# Patient Record
Sex: Male | Born: 1990 | Hispanic: No | Marital: Single | Smoking: Current every day smoker
Health system: Southern US, Community
[De-identification: ages and names within clinical notes are randomized; demographics above are authoritative.]

## PROBLEM LIST (undated history)

## (undated) DIAGNOSIS — I499 Cardiac arrhythmia, unspecified: Secondary | ICD-10-CM

## (undated) DIAGNOSIS — N200 Calculus of kidney: Secondary | ICD-10-CM

---
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
## 2011-04-25 ENCOUNTER — Emergency Department: Payer: Self-pay | Admitting: Emergency Medicine

## 2011-10-14 ENCOUNTER — Emergency Department: Payer: Self-pay | Admitting: *Deleted

## 2011-10-16 ENCOUNTER — Emergency Department: Payer: Self-pay | Admitting: Emergency Medicine

## 2012-01-09 ENCOUNTER — Emergency Department: Payer: Self-pay | Admitting: *Deleted

## 2012-04-04 ENCOUNTER — Emergency Department: Payer: Self-pay | Admitting: Emergency Medicine

## 2012-09-18 ENCOUNTER — Emergency Department: Payer: Self-pay | Admitting: Emergency Medicine

## 2013-10-29 ENCOUNTER — Emergency Department: Payer: Self-pay | Admitting: Emergency Medicine

## 2014-05-19 ENCOUNTER — Emergency Department: Payer: Self-pay | Admitting: Emergency Medicine

## 2015-02-21 ENCOUNTER — Encounter: Payer: Self-pay | Admitting: Emergency Medicine

## 2015-02-21 ENCOUNTER — Emergency Department
Admission: EM | Admit: 2015-02-21 | Discharge: 2015-02-21 | Disposition: A | Discharge status: Home / Self Care | Payer: Self-pay | Attending: Emergency Medicine | Admitting: Emergency Medicine

## 2015-02-21 DIAGNOSIS — M436 Torticollis: Secondary | ICD-10-CM | POA: Insufficient documentation

## 2015-02-21 DIAGNOSIS — Z720 Tobacco use: Secondary | ICD-10-CM | POA: Insufficient documentation

## 2015-02-21 MED ORDER — CYCLOBENZAPRINE HCL 10 MG PO TABS: 10.0000 mg | ORAL_TABLET | Freq: Three times a day (TID) | ORAL | Status: AC | PRN

## 2015-02-21 MED ORDER — DIAZEPAM 5 MG PO TABS
5.0000 mg | ORAL_TABLET | Freq: Once | ORAL | Status: AC
  Administered 2015-02-21: 5 mg via ORAL
  Filled 2015-02-21: qty 1

## 2015-02-21 NOTE — ED Notes (Signed)
Patient presents to ED with c/o neck pain since 8 am yesterday morning. 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 Patient denies other symptoms. Patient alert and oriented x 4, respirations even and unlabored. NAD noted at this time. MD at bedside.

## 2015-02-21 NOTE — ED Notes (Signed)
Patient ambulatory to triage with steady gait, without difficulty or distress noted; pt reports awoke Monday morning with neck pain that increases with any movement of head; denies any injury

## 2015-02-21 NOTE — ED Provider Notes (Signed)
[facility] Emergency Department Provider Note  ____________________________________________  Time seen: 2:40 AM  I have reviewed the triage vital signs and the nursing notes.   HISTORY  Chief Complaint Torticollis      HPI Tony Lowery is a 24 y.o. male resents with bilateral neck pain on awakening on Monday morning. Patient states his pain is worse with movement of his head in all directions worse to the right. Patient denies any fever no headache no nausea or vomiting. Patient denies any trauma no injury at all.     Past medical history None  There are no active problems to display for this patient.   Past surgical history None No current outpatient prescriptions on file.  Allergies No known drug allergies No family history on file.  Social History Social History  Substance Use Topics  . Smoking status: Current Every Day Smoker -- 1.00 packs/day    Types: Cigarettes  . Smokeless tobacco: None  . Alcohol Use: No    Review of Systems  Constitutional: Negative for fever. Eyes: Negative for visual changes. ENT: Negative for sore throat. Cardiovascular: Negative for chest pain. Respiratory: Negative for shortness of breath. Gastrointestinal: Negative for abdominal pain, vomiting and diarrhea. Genitourinary: Negative for dysuria. Musculoskeletal: Negative for back pain. Skin: Negative for rash. Neurological: Negative for headaches, focal weakness or numbness.   10-point ROS otherwise negative.  ____________________________________________   PHYSICAL EXAM:  VITAL SIGNS: ED Triage Vitals  Enc Vitals Group     BP 02/21/15 0035 145/71 mmHg     Pulse Rate 02/21/15 0035 56     Resp --      Temp 02/21/15 0035 98.7 F (37.1 C)     Temp Source 02/21/15 0035 Oral     SpO2 02/21/15 0035 98 %     Weight 02/21/15 0035 220 lb (99.791 kg)     Height 02/21/15 0035 5\' 7"  (1.702 m)     Head Cir --      Peak Flow --      Pain Score  02/21/15 0035 10     Pain Loc --      Pain Edu? --      Excl. in GC? --      Constitutional: Alert and oriented. Well appearing and in no distress. Eyes: Conjunctivae are normal. PERRL. Normal extraocular movements. ENT   Head: Normocephalic and atraumatic.   Nose: No congestion/rhinnorhea.   Mouth/Throat: Mucous membranes are moist.   Neck: No stridor. Hematological/Lymphatic/Immunilogical: No cervical lymphadenopathy. Cardiovascular: Normal rate, regular rhythm. Normal and symmetric distal pulses are present in all extremities. No murmurs, rubs, or gallops. Respiratory: Normal respiratory effort without tachypnea nor retractions. Breath sounds are clear and equal bilaterally. No wheezes/rales/rhonchi. Gastrointestinal: Soft and nontender. No distention. There is no CVA tenderness. Genitourinary: deferred Musculoskeletal: Pain to palpation of the trapezius muscles bilaterally. Pain with passive and active range of motion of the neck.  Neurologic:  Normal speech and language. No gross focal neurologic deficits are appreciated. Speech is normal.  Skin:  Skin is warm, dry and intact. No rash noted. Psychiatric: Mood and affect are normal. Speech and behavior are normal. Patient exhibits appropriate insight and judgment.     INITIAL IMPRESSION / ASSESSMENT AND PLAN / ED COURSE  Pertinent labs & imaging results that were available during my care of the patient were reviewed by me and considered in my medical decision making (see chart for details).  Strip physical exam consistent with torticollis. Considered other etiologies for  this such as meningitis however no headache fever or other concomitant symptoms consistent with meningitis.  ____________________________________________   FINAL CLINICAL IMPRESSION(S) / ED DIAGNOSES  Final diagnoses:  Acute torticollis      Darci Current, MD 02/21/15 [phone]

## 2015-02-21 NOTE — Discharge Instructions (Signed)

## 2015-11-15 ENCOUNTER — Encounter: Payer: Self-pay | Admitting: Emergency Medicine

## 2015-11-15 ENCOUNTER — Emergency Department
Admission: EM | Admit: 2015-11-15 | Discharge: 2015-11-15 | Disposition: A | Discharge status: Home / Self Care | Payer: Self-pay | Attending: Emergency Medicine | Admitting: Emergency Medicine

## 2015-11-15 DIAGNOSIS — Y929 Unspecified place or not applicable: Secondary | ICD-10-CM | POA: Insufficient documentation

## 2015-11-15 DIAGNOSIS — Y939 Activity, unspecified: Secondary | ICD-10-CM | POA: Insufficient documentation

## 2015-11-15 DIAGNOSIS — Y999 Unspecified external cause status: Secondary | ICD-10-CM | POA: Insufficient documentation

## 2015-11-15 DIAGNOSIS — T161XXA Foreign body in right ear, initial encounter: Secondary | ICD-10-CM | POA: Insufficient documentation

## 2015-11-15 DIAGNOSIS — F17210 Nicotine dependence, cigarettes, uncomplicated: Secondary | ICD-10-CM | POA: Insufficient documentation

## 2015-11-15 DIAGNOSIS — X58XXXA Exposure to other specified factors, initial encounter: Secondary | ICD-10-CM | POA: Insufficient documentation

## 2015-11-15 MED ORDER — LIDOCAINE HCL (PF) 1 % IJ SOLN
5.0000 mL | Freq: Once | INTRAMUSCULAR | Status: AC
  Administered 2015-11-15: 5 mL

## 2015-11-15 MED ORDER — ACETAMINOPHEN 500 MG PO TABS
1000.0000 mg | ORAL_TABLET | Freq: Once | ORAL | Status: AC
  Administered 2015-11-15: 1000 mg via ORAL

## 2015-11-15 NOTE — ED Notes (Signed)
Dr Manson PasseyBrown to triage to exam pt; instilled 5ml 1% xylocaine and insect removed; pt tolerated well

## 2015-11-15 NOTE — ED Notes (Signed)
Pt presents to ED with c/o "bug in right ear." Pt report feels movement in ear, began about 10 mins PTA.

## 2015-11-15 NOTE — ED Provider Notes (Signed)
[facility] Emergency Department Provider Note  ____________________________________________  Time seen: 2:10 AM  I have reviewed the triage vital signs and the nursing notes.   HISTORY  Chief Complaint Foreign Body in Ear    HPI Edwinna Areoladgar Jabbour is a 25 y.o. male presents with "bug in his right ear". Patient states this occurred approximately 10 minutes before presentation to the emergency part.    Past medical history None There are no active problems to display for this patient.  Past surgical history None Current Outpatient Rx  Name  Route  Sig  Dispense  Refill  . cyclobenzaprine (FLEXERIL) 10 MG tablet   Oral   Take 1 tablet (10 mg total) by mouth every 8 (eight) hours as needed for muscle spasms.   30 tablet   1     Allergies No known drug allergies No family history on file.  Social History Social History  Substance Use Topics  . Smoking status: Current Every Day Smoker -- 1.00 packs/day    Types: Cigarettes  . Smokeless tobacco: None  . Alcohol Use: No    Review of Systems  Constitutional: Negative for fever. Eyes: Negative for visual changes. ENT: Negative for sore throat. Cardiovascular: Negative for chest pain. Respiratory: Negative for shortness of breath. Gastrointestinal: Negative for abdominal pain, vomiting and diarrhea. Genitourinary: Negative for dysuria. Musculoskeletal: Negative for back pain. Skin: Negative for rash. Neurological: Negative for headaches, focal weakness or numbness.   10-point ROS otherwise negative.  ____________________________________________   PHYSICAL EXAM:  VITAL SIGNS: ED Triage Vitals  Enc Vitals Group     BP 11/15/15 0151 121/79 mmHg     Pulse Rate 11/15/15 0151 68     Resp 11/15/15 0151 18     Temp 11/15/15 0151 98.1 F (36.7 C)     Temp Source 11/15/15 0151 Oral     SpO2 11/15/15 0151 98 %     Weight 11/15/15 0151 230 lb (104.327 kg)     Height 11/15/15 0151 5\' 7"   (1.702 m)     Head Cir --      Peak Flow --      Pain Score 11/15/15 0157 10     Pain Loc --      Pain Edu? --      Excl. in GC? --      Constitutional: Alert and oriented. Well appearing and in no distress. Eyes: Conjunctivae are normal. PERRL. Normal extraocular movements. ENT   Head: Normocephalic and atraumatic.   Nose: No congestion/rhinnorhea.   Mouth/Throat: Mucous membranes are moist.   Neck: No stridor. Ears: Insect noted inside the patient's right external auditory canal Hematological/Lymphatic/Immunilogical: No cervical lymphadenopathy.    Procedure note:  Insect removed from the right external auditory canal via suction. Visualization of the tympanic membrane status post procedure revealed no evidence of injury.   INITIAL IMPRESSION / ASSESSMENT AND PLAN / ED COURSE  Pertinent labs & imaging results that were available during my care of the patient were reviewed by me and considered in my medical decision making (see chart for details).  Insect removed with suction.  ____________________________________________   FINAL CLINICAL IMPRESSION(S) / ED DIAGNOSES  Final diagnoses:  Foreign body in ear, right, initial encounter      Darci Currentandolph N Peityn Payton, MD 11/15/15 [phone]

## 2015-11-15 NOTE — Discharge Instructions (Signed)
Ear Foreign Body °An ear foreign body is an object that is stuck in your ear. The object is usually stuck in the ear canal. °CAUSES °In all ages of people, the most common foreign bodies are insects that enter the ear canal. It is common for young children to put objects into the ear canal. These may include pebbles, beads, parts of toys, and any other small objects that fit into the ear. In adults, objects such as cotton swabs may become lodged in the ear canal.  °SIGNS AND SYMPTOMS °A foreign body in the ear may cause: °· Pain. °· Buzzing or roaring sounds. °· Hearing loss. °· Ear drainage or bleeding. °· Nausea and vomiting. °· A feeling that your ear is full. °DIAGNOSIS °Your health care provider may be able to diagnose an ear foreign body based on the information that you provide, your symptoms, and a physical exam. Your health care provider may also perform tests, such as testing your hearing and your ear pressure, to check for infection or other problems that are caused by the foreign body in your ear. °TREATMENT °Treatment depends on what the foreign body is, the location of the foreign body in your ear, and whether or not the foreign body has injured any part of your inner ear. If the foreign body is visible to your health care provider, it may be possible to remove the foreign body using: °· A tool, such as medical tweezers (forceps) or a suction tube (catheter). °· Irrigation. This uses water to flush the foreign body out of your ear. This is used only if the foreign body is not likely to swell or enlarge when it is put in water. °If the foreign body is not visible or your health care provider was not able to remove the foreign body, you may be referred to a specialist for removal. You may also be prescribed antibiotic medicine or ear drops to prevent infection. If the foreign body has caused injury to other parts of your ear, you may need additional treatment. °HOME CARE INSTRUCTIONS °· Keep all  follow-up visits as directed by your health care provider. This is important. °· Take medicines only as directed by your health care provider. °· If you were prescribed an antibiotic medicine, finish it all even if you start to feel better. °PREVENTION °· Keep small objects out of reach of young children. Tell children not to put anything in their ears. °· Do not put anything in your ear, including cotton swabs, to clean your ears. Talk to your health care provider about how to clean your ears safely. °SEEK MEDICAL CARE IF: °· You have a headache. °· Your have blood coming from your ear. °· You have a fever. °· You have increased pain or swelling of your ear. °· Your hearing is reduced. °· You have discharge coming from your ear. °  °This information is not intended to replace advice given to you by your health care provider. Make sure you discuss any questions you have with your health care provider. °  °Document Released: 05/31/2000 Document Revised: 06/24/2014 Document Reviewed: 01/17/2014 °Elsevier Interactive Patient Education ©2016 Else[employer] Inc[employer] ° °

## 2016-01-24 ENCOUNTER — Encounter: Payer: Self-pay | Admitting: Emergency Medicine

## 2016-01-24 ENCOUNTER — Emergency Department
Admission: EM | Admit: 2016-01-24 | Discharge: 2016-01-24 | Disposition: A | Discharge status: Home / Self Care | Payer: Self-pay | Attending: Emergency Medicine | Admitting: Emergency Medicine

## 2016-01-24 DIAGNOSIS — K029 Dental caries, unspecified: Secondary | ICD-10-CM | POA: Insufficient documentation

## 2016-01-24 DIAGNOSIS — F17210 Nicotine dependence, cigarettes, uncomplicated: Secondary | ICD-10-CM | POA: Insufficient documentation

## 2016-01-24 MED ORDER — AMOXICILLIN 500 MG PO CAPS: 500.0000 mg | ORAL_CAPSULE | Freq: Three times a day (TID) | ORAL | 0 refills | Status: DC

## 2016-01-24 MED ORDER — IBUPROFEN 600 MG PO TABS: 600.0000 mg | ORAL_TABLET | Freq: Three times a day (TID) | ORAL | 0 refills | Status: DC | PRN

## 2016-01-24 MED ORDER — LIDOCAINE VISCOUS 2 % MT SOLN
15.0000 mL | Freq: Once | OROMUCOSAL | Status: AC
  Administered 2016-01-24: 15 mL via OROMUCOSAL
  Filled 2016-01-24: qty 15

## 2016-01-24 MED ORDER — OXYCODONE-ACETAMINOPHEN 5-325 MG PO TABS
1.0000 | ORAL_TABLET | Freq: Once | ORAL | Status: AC
  Administered 2016-01-24: 1 via ORAL
  Filled 2016-01-24: qty 1

## 2016-01-24 MED ORDER — IBUPROFEN 600 MG PO TABS
600.0000 mg | ORAL_TABLET | Freq: Once | ORAL | Status: AC
  Administered 2016-01-24: 600 mg via ORAL
  Filled 2016-01-24: qty 1

## 2016-01-24 MED ORDER — LIDOCAINE VISCOUS 2 % MT SOLN: 5.0000 mL | Freq: Four times a day (QID) | OROMUCOSAL | 0 refills | Status: DC | PRN

## 2016-01-24 MED ORDER — TRAMADOL HCL 50 MG PO TABS: 50.0000 mg | ORAL_TABLET | Freq: Four times a day (QID) | ORAL | 0 refills | Status: AC | PRN

## 2016-01-24 NOTE — ED Provider Notes (Signed)
``````````````````````````````````````  [facility] Emergency Department Provider Note   ____________________________________________   First MD Initiated Contact with Patient 01/24/16 1223     (approximate)  I have reviewed the triage vital signs and the nursing notes.   HISTORY  Chief Complaint Dental Pain    HPI Edwinna Areoladgar Karch is a 25 y.o. male patient complaining of dental pain to the left molar area for 2-3 days. Patient state a week ago filling fell out of the tooth. Patient is rating the pain as a 10 over 10. Patient say  using Tylenol and ice water for pain control.Patient denies any gum swelling, fever, or drainage from the area.   History reviewed. No pertinent past medical history.  There are no active problems to display for this patient.   History reviewed. No pertinent surgical history.  Prior to Admission medications   Medication Sig Start Date End Date Taking? Authorizing Provider  cyclobenzaprine (FLEXERIL) 10 MG tablet Take 1 tablet (10 mg total) by mouth every 8 (eight) hours as needed for muscle spasms. 02/21/15 02/21/16  Darci Currentandolph N Brown, MD    Allergies Review of patient's allergies indicates no known allergies.  No family history on file.  Social History Social History  Substance Use Topics  . Smoking status: Current Every Day Smoker    Packs/day: 1.00    Types: Cigarettes  . Smokeless tobacco: Never Used  . Alcohol use Yes     Comment: rarely    Review of Systems Constitutional: No fever/chills Eyes: No visual changes. ENT: No sore throat. Dental pain Cardiovascular: Denies chest pain. Respiratory: Denies shortness of breath. Gastrointestinal: No abdominal pain.  No nausea, no vomiting.  No diarrhea.  No constipation. Genitourinary: Negative for dysuria. Musculoskeletal: Negative for back pain. Skin: Negative for rash. Neurological: Negative for headaches, focal weakness or  numbness.   ____________________________________________   PHYSICAL EXAM:  VITAL SIGNS: ED Triage Vitals  Enc Vitals Group     BP 01/24/16 1159 120/73     Pulse Rate 01/24/16 1159 (!) 59     Resp 01/24/16 1159 18     Temp 01/24/16 1159 97.5 F (36.4 C)     Temp Source 01/24/16 1159 Axillary     SpO2 01/24/16 1159 100 %     Weight 01/24/16 1156 230 lb (104.3 kg)     Height 01/24/16 1156 5\' 7"  (1.702 m)     Head Circumference --      Peak Flow --      Pain Score 01/24/16 1156 10     Pain Loc --      Pain Edu? --      Excl. in GC? --     Constitutional: Alert and oriented. Well appearing and in no acute distress. Eyes: Conjunctivae are normal. PERRL. EOMI. Head: Atraumatic. Nose: No congestion/rhinnorhea. Mouth/Throat: Mucous membranes are moist.  Oropharynx non-erythematous.Devitalize tooth #18 Neck: No stridor.  No cervical spine tenderness to palpation. Hematological/Lymphatic/Immunilogical: No cervical lymphadenopathy. Cardiovascular: Normal rate, regular rhythm. Grossly normal heart sounds.  Good peripheral circulation. Respiratory: Normal respiratory effort.  No retractions. Lungs CTAB. Gastrointestinal: Soft and nontender. No distention. No abdominal bruits. No CVA tenderness. Musculoskeletal: No lower extremity tenderness nor edema.  No joint effusions. Neurologic:  Normal speech and language. No gross focal neurologic deficits are appreciated. No gait instability. Skin:  Skin is warm, dry and intact. No rash noted. Psychiatric: Mood and affect are normal. Speech and behavior are normal.  ____________________________________________   LABS (all labs ordered are  listed, but only abnormal results are displayed)  Labs Reviewed - No data to display ____________________________________________  EKG   ____________________________________________  RADIOLOGY   ____________________________________________   PROCEDURES  Procedure(s) performed:  None  Procedures  Critical Care performed: No  ____________________________________________   INITIAL IMPRESSION / ASSESSMENT AND PLAN / ED COURSE  Pertinent labs & imaging results that were available during my care of the patient were reviewed by me and considered in my medical decision making (see chart for details).  Dental pain secondary to caries. Patient given discharge care instructions. Patient is a dental clinics for follow-up. Patient given a prescription for amoxicillin, ibuprofen, and tramadol.  Clinical Course     ____________________________________________   FINAL CLINICAL IMPRESSION(S) / ED DIAGNOSES  Final diagnoses:  Pain due to dental caries      NEW MEDICATIONS STARTED DURING THIS VISIT:  New Prescriptions   No medications on file     Note:  This document was prepared using Dragon voice recognition software and may include unintentional dictation errors.    Joni Reining, PA-C 01/24/16 1237    Jennye Moccasin, MD 01/24/16 1302

## 2016-01-24 NOTE — ED Triage Notes (Signed)
Patient presents to the ED with dental pain to his left lower teeth.  Patient states pain began 2-3 days ago.  Patient is in no obvious distress at this time.

## 2016-01-24 NOTE — Discharge Instructions (Signed)
Follow-up from the list of dental clinics provided. °OPTIONS FOR DENTAL FOLLOW UP CARE ° °[place] [facility] - Local Safety Net Dental Clinics °http://www.ncdhhs.gov/dph/oralhealth/services/safetynetclinics.htm °  °[facility] (336-562-3123) ° °[facility] [place] ([phone]) ° °[facility] ([phone] ext [phone]) ° °[facility] ([phone]) ° °[facility] ([phone]) °This clinic caters to the indigent population and is on a lottery system. °Location: °[facility], [address], [place] °Clinic Hours: °Wednesdays from 6pm - 9pm, patients seen by a lottery system. °For dates, call or go to [email or web address]/[facility]/patients/[email or web address] °Services: °Cleanings, fillings and simple extractions. °Payment Options: °DENTAL WORK IS FREE OF CHARGE. Bring proof of income or support. °Best way to get seen: °Arrive at 5:15 pm - this is a lottery, NOT first come/first serve, so arriving earlier will not increase your chances of being seen. °  °  °[facility] °[phone] °Select option 1 for emergencies °  °Location: °[facility], [address], [place] °Clinic Hours: °No walk-ins accepted - call the day before to schedule an appointment. °Check in times are 9:30 am and 1:30 pm. °Services: °Simple extractions, temporary fillings, pulpectomy/pulp debridement, uncomplicated abscess drainage. °Payment Options: °PAYMENT IS DUE AT THE TIME OF SERVICE.  Fee is usually $100-200, additional surgical procedures (e.g. abscess drainage) may be extra. °Cash, checks, Visa/MasterCard accepted.  Can file Medicaid if patient is covered for dental - patient should call case worker to check. °No discount for [facility] patients. °Best way to get seen: °MUST call the day before and get onto the schedule. Can usually be seen the next 1-2 days. No walk-ins accepted. °  °   °[facility] °[phone] °  °Location: °[facility], [address], [place] °Clinic Hours: °M, W, Th, F 8am or 1:30pm, Tues 9a or 1:30 - first come/first served. °Services: °Simple extractions, temporary fillings, uncomplicated abscess drainage.  You do not need to be an [place] resident. °Payment Options: °PAYMENT IS DUE AT THE TIME OF SERVICE. °Dental insurance, otherwise sliding scale - bring proof of income or support. °Depending on income and treatment needed, cost is usually $50-200. °Best way to get seen: °Arrive early as it is first come/first served. °  °  °[facility] °[phone] °  °Location: °[address] °Clinic Hours: °Mon-Thu 8a-5p °Services: °Most basic dental services including extractions and fillings. °Payment Options: °PAYMENT IS DUE AT THE TIME OF SERVICE. °Sliding scale, up to 50% off - bring proof if income or support. °Medicaid with dental option accepted. °Best way to get seen: °Call to schedule an appointment, can usually be seen within 2 weeks OR they will try to see walk-ins - show up at 8a or 2p (you may have to wait). °  °  °[facility] °[phone] °[place] RESIDENTS ONLY °  °Location: °[facility], [address], [place], [place] [address] °Clinic Hours: By appointment only. °Monday - Thursday 8am-5pm, Friday 8am-12pm °Services: Cleanings, fillings, extractions. °Payment Options: °PAYMENT IS DUE AT THE TIME OF SERVICE. °Cash, Visa or MasterCard. Sliding scale - $30 minimum per service. °Best way to get seen: °Come in to office, complete packet and make an appointment - need proof of income °or support monies for each household member and proof of [place] residence. °Usually takes about a month to get in. °  °  °[facility] °[phone] °  °Location: °[address] [place] °Clinic Hours: Walk-in Urgent Care  Dental Services are offered Monday-Friday mornings only. °The numbers of emergencies accepted daily is limited to the number of °providers available. °Maximum 15 - Mondays, Wednesdays & Thursdays °Maximum 10 - Tuesdays & Fridays °Services: °You do not need to be a [place] resident to be seen for a dental emergency. °Emergencies are defined as pain, swelling, abnormal bleeding, or dental trauma. Walkins will receive x-rays if needed. °NOTE: Dental cleaning is not an emergency. °Payment Options: °PAYMENT IS DUE AT THE TIME OF SERVICE. °Minimum co-pay is $40.00 for uninsured patients. °Minimum co-pay is $3.00 for Medicaid with dental coverage. °Dental Insurance is accepted and must be presented at time of visit. °Medicare does not cover dental. °Forms of payment: Cash, credit card, checks. °Best way to get seen: °If not previously registered with the clinic, walk-in dental registration begins at 7:15 am and is on a first come/first serve basis. °If previously registered with the clinic, call to make an appointment. °  °  °[facility] °[phone] °[place] RESIDENTS ONLY °  °Location: °[address], [place], [place] °Clinic Hours: °Mon-Thu 10a-2p °Services: Extractions only! °Payment Options: °FREE (donations accepted) - bring proof of income or support °Best way to get seen: °Call and schedule an appointment OR come at 8am on the 1st Monday of every month (except for holidays) when it is first come/first served. °  °  °[facility] °[phone] °  °Location: °[address], [place] °Clinic Hours: °Friday mornings °Services, Payment Options, Best way to get seen: °Call for info ° °

## 2016-01-24 NOTE — ED Notes (Signed)
Pt to ed with c/o left lower toothache x 3 days.

## 2016-03-09 ENCOUNTER — Emergency Department
Admission: EM | Admit: 2016-03-09 | Discharge: 2016-03-09 | Disposition: A | Discharge status: Home / Self Care | Payer: Self-pay | Attending: Emergency Medicine | Admitting: Emergency Medicine

## 2016-03-09 DIAGNOSIS — Y929 Unspecified place or not applicable: Secondary | ICD-10-CM | POA: Insufficient documentation

## 2016-03-09 DIAGNOSIS — T162XXA Foreign body in left ear, initial encounter: Secondary | ICD-10-CM | POA: Insufficient documentation

## 2016-03-09 DIAGNOSIS — Y939 Activity, unspecified: Secondary | ICD-10-CM | POA: Insufficient documentation

## 2016-03-09 DIAGNOSIS — Y999 Unspecified external cause status: Secondary | ICD-10-CM | POA: Insufficient documentation

## 2016-03-09 DIAGNOSIS — X58XXXA Exposure to other specified factors, initial encounter: Secondary | ICD-10-CM | POA: Insufficient documentation

## 2016-03-09 DIAGNOSIS — F17210 Nicotine dependence, cigarettes, uncomplicated: Secondary | ICD-10-CM | POA: Insufficient documentation

## 2016-03-09 DIAGNOSIS — Z791 Long term (current) use of non-steroidal anti-inflammatories (NSAID): Secondary | ICD-10-CM | POA: Insufficient documentation

## 2016-03-09 MED ORDER — CIPROFLOXACIN-DEXAMETHASONE 0.3-0.1 % OT SUSP
4.0000 [drp] | Freq: Once | OTIC | Status: AC
  Administered 2016-03-09: 4 [drp] via OTIC
  Filled 2016-03-09: qty 7.5

## 2016-03-09 MED ORDER — LIDOCAINE VISCOUS 2 % MT SOLN
15.0000 mL | Freq: Once | OROMUCOSAL | Status: AC
  Administered 2016-03-09: 15 mL via OROMUCOSAL

## 2016-03-09 NOTE — ED Notes (Signed)

## 2016-03-09 NOTE — ED Notes (Signed)
MD to triage to attempt FB removal from LEFT ear. Lidocaine instilled prior to attempts. MD attempted removal with alligator forceps and with Frazier tip suction. Insect ultimately removed.

## 2016-03-09 NOTE — ED Provider Notes (Signed)
[facility] Emergency Department Provider Note    First MD Initiated Contact with Patient 03/09/16 0245     (approximate)  I have reviewed the triage vital signs and the nursing notes.   HISTORY  Chief Complaint Foreign Body in Ear    HPI Tony Lowery is a 25 y.o. male presents with insect in his left ear which occurred approximately 30 minutes before presenting to the emergency department..     There are no active problems to display for this patient.   Past medical history None Past surgical history None  Prior to Admission medications   Medication Sig Start Date End Date Taking? Authorizing Provider  amoxicillin (AMOXIL) 500 MG capsule Take 1 capsule (500 mg total) by mouth 3 (three) times daily. 01/24/16   Joni Reining, PA-C  ibuprofen (ADVIL,MOTRIN) 600 MG tablet Take 1 tablet (600 mg total) by mouth every 8 (eight) hours as needed. 01/24/16   Joni Reining, PA-C  lidocaine (XYLOCAINE) 2 % solution Use as directed 5 mLs in the mouth or throat every 6 (six) hours as needed for mouth pain. 01/24/16   Joni Reining, PA-C  traMADol (ULTRAM) 50 MG tablet Take 1 tablet (50 mg total) by mouth every 6 (six) hours as needed. 01/24/16 01/23/17  Joni Reining, PA-C    Allergies No known drug allergies No family history on file.  Social History Social History  Substance Use Topics  . Smoking status: Current Every Day Smoker    Packs/day: 1.00    Types: Cigarettes  . Smokeless tobacco: Never Used  . Alcohol use Yes     Comment: rarely    Review of Systems Constitutional: No fever/chills Eyes: No visual changes. ENT: No sore throat.Positive for bug in his left ear Cardiovascular: Denies chest pain. Respiratory: Denies shortness of breath. Gastrointestinal: No abdominal pain.  No nausea, no vomiting.  No diarrhea.  No constipation. Genitourinary: Negative for dysuria. Musculoskeletal: Negative for back pain. Skin: Negative for  rash. Neurological: Negative for headaches, focal weakness or numbness.  10-point ROS otherwise negative.  ____________________________________________   PHYSICAL EXAM:  VITAL SIGNS: ED Triage Vitals  Enc Vitals Group     BP 03/09/16 0230 135/80     Pulse Rate 03/09/16 0230 (!) 105     Resp 03/09/16 0230 20     Temp 03/09/16 0230 98.2 F (36.8 C)     Temp Source 03/09/16 0230 Oral     SpO2 03/09/16 0230 97 %     Weight 03/09/16 0229 230 lb (104.3 kg)     Height 03/09/16 0229 5\' 7"  (1.702 m)     Head Circumference --      Peak Flow --      Pain Score 03/09/16 0229 10     Pain Loc --      Pain Edu? --      Excl. in GC? --     Constitutional: Alert and oriented. Apparent discomfort Eyes: Conjunctivae are normal. PERRL. EOMI. Head: Atraumatic. Ears:  Cerumen an insect noted in the left external auditory canal Nose: No congestion/rhinnorhea. Mouth/Throat: Mucous membranes are moist.  Oropharynx non-erythematous. Neurologic:  Normal speech and language. No gross focal neurologic deficits are appreciated.  Skin:  Skin is warm, dry and intact. No rash noted. Psychiatric: Mood and affect are normal. Speech and behavior are normal  ____________________________________________  ____________________________________________   PROCEDURES  Procedure(s) performed:   .Foreign Body Removal Date/Time: 03/09/2016 3:40 AM Performed by: Bayard Males  N Authorized by: Darci CurrentBROWN, [place] N  Consent: Verbal consent obtained. Consent given by: patient Patient understanding: patient states understanding of the procedure being performed Patient identity confirmed: verbally with patient Body area: ear Anesthesia: local infiltration  Anesthesia: Local Anesthetic: topical anesthetic  Sedation: Patient sedated: no Patient restrained: no Localization method: ENT speculum Complexity: complex 1 objects recovered. Objects recovered: Insect Post-procedure assessment: foreign body  removed Patient tolerance: Patient tolerated the procedure well with no immediate complications       INITIAL IMPRESSION / ASSESSMENT AND PLAN / ED COURSE  Pertinent labs & imaging results that were available during my care of the patient were reviewed by me and considered in my medical decision making (see chart for details).  Tympanic membrane visualized after removal and appeared to be intact.  Clinical Course    ____________________________________________  FINAL CLINICAL IMPRESSION(S) / ED DIAGNOSES  Final diagnoses:  Foreign body in ear, left, initial encounter     MEDICATIONS GIVEN DURING THIS VISIT:  Medications  ciprofloxacin-dexamethasone (CIPRODEX) 0.3-0.1 % otic suspension 4 drop (not administered)  lidocaine (XYLOCAINE) 2 % viscous mouth solution 15 mL (15 mLs Mouth/Throat Given 03/09/16 0312)     NEW OUTPATIENT MEDICATIONS STARTED DURING THIS VISIT:  New Prescriptions   No medications on file    Modified Medications   No medications on file    Discontinued Medications   No medications on file     Note:  This document was prepared using Dragon voice recognition software and may include unintentional dictation errors.    Darci Currentandolph N Brown, MD 03/09/16 [phone]

## 2016-03-09 NOTE — ED Triage Notes (Addendum)
Pt reports to ED w/ c/o bug in ear, sts he can feel it moving.  FB noted in pts L ear, no movement visualized by this RN. Pt denies CP, SOB, N/V/D, LOC or falls.  Pt A/OX4, skin tone even,  warm and dry.

## 2016-04-05 ENCOUNTER — Encounter: Payer: Self-pay | Admitting: Emergency Medicine

## 2016-04-05 ENCOUNTER — Emergency Department
Admission: EM | Admit: 2016-04-05 | Discharge: 2016-04-05 | Disposition: A | Discharge status: Home / Self Care | Payer: Self-pay | Attending: Emergency Medicine | Admitting: Emergency Medicine

## 2016-04-05 DIAGNOSIS — F17210 Nicotine dependence, cigarettes, uncomplicated: Secondary | ICD-10-CM | POA: Insufficient documentation

## 2016-04-05 DIAGNOSIS — Z79899 Other long term (current) drug therapy: Secondary | ICD-10-CM | POA: Insufficient documentation

## 2016-04-05 DIAGNOSIS — N342 Other urethritis: Secondary | ICD-10-CM | POA: Insufficient documentation

## 2016-04-05 DIAGNOSIS — Z792 Long term (current) use of antibiotics: Secondary | ICD-10-CM | POA: Insufficient documentation

## 2016-04-05 LAB — URINALYSIS COMPLETE WITH MICROSCOPIC (ARMC ONLY)
Bacteria, UA: NONE SEEN
Bilirubin Urine: NEGATIVE
Glucose, UA: NEGATIVE mg/dL
Hgb urine dipstick: NEGATIVE
KETONES UR: NEGATIVE mg/dL
LEUKOCYTES UA: NEGATIVE
NITRITE: NEGATIVE
PROTEIN: NEGATIVE mg/dL
SPECIFIC GRAVITY, URINE: 1.024 (ref 1.005–1.030)
Squamous Epithelial / LPF: NONE SEEN
pH: 7 (ref 5.0–8.0)

## 2016-04-05 LAB — CHLAMYDIA/NGC RT PCR (ARMC ONLY)
Chlamydia Tr: NOT DETECTED
N gonorrhoeae: NOT DETECTED

## 2016-04-05 MED ORDER — CEFTRIAXONE SODIUM 1 G IJ SOLR
500.0000 mg | Freq: Once | INTRAMUSCULAR | Status: AC
  Administered 2016-04-05: 500 mg via INTRAMUSCULAR
  Filled 2016-04-05: qty 10

## 2016-04-05 MED ORDER — AZITHROMYCIN 500 MG PO TABS
1000.0000 mg | ORAL_TABLET | Freq: Once | ORAL | Status: AC
  Administered 2016-04-05: 1000 mg via ORAL
  Filled 2016-04-05: qty 2

## 2016-04-05 NOTE — ED Provider Notes (Signed)
[facility]-EMERGENCY DEPARTMENT Provider Note   CSN: [id number] Arrival date & time: 04/05/16  1847     History   Chief Complaint Chief Complaint  Patient presents with  . Groin Burn  . SEXUALLY TRANSMITTED DISEASE    HPI Tony Lowery is a 25 y.o. male presents to the emergency department for evaluation of burning along the tip of the penis with penile discharge. Symptoms have been present for 3 days. Patient had unprotected sex with a new partner 5 days ago. He denies any testicular pain, abdominal pain, fevers. There've been no rashes. He has had mild drainage from the tip of the penis with no dysuria.  HPI  History reviewed. No pertinent past medical history.  There are no active problems to display for this patient.   History reviewed. No pertinent surgical history.     Home Medications    Prior to Admission medications   Medication Sig Start Date End Date Taking? Authorizing Provider  amoxicillin (AMOXIL) 500 MG capsule Take 1 capsule (500 mg total) by mouth 3 (three) times daily. 01/24/16   Joni Reiningonald K Smith, PA-C  ibuprofen (ADVIL,MOTRIN) 600 MG tablet Take 1 tablet (600 mg total) by mouth every 8 (eight) hours as needed. 01/24/16   Joni Reiningonald K Smith, PA-C  lidocaine (XYLOCAINE) 2 % solution Use as directed 5 mLs in the mouth or throat every 6 (six) hours as needed for mouth pain. 01/24/16   Joni Reiningonald K Smith, PA-C  traMADol (ULTRAM) 50 MG tablet Take 1 tablet (50 mg total) by mouth every 6 (six) hours as needed. 01/24/16 01/23/17  Joni Reiningonald K Smith, PA-C    Family History History reviewed. No pertinent family history.  Social History Social History  Substance Use Topics  . Smoking status: Current Every Day Smoker    Packs/day: 1.00    Types: Cigarettes  . Smokeless tobacco: Never Used  . Alcohol use Yes     Comment: rarely     Allergies   Amoxicillin   Review of Systems Review of Systems  Constitutional: Negative.  Negative for activity change, appetite change, chills  and fever.  HENT: Negative for congestion, ear pain, mouth sores, rhinorrhea, sinus pressure, sore throat and trouble swallowing.   Eyes: Negative for photophobia, pain and discharge.  Respiratory: Negative for cough, chest tightness and shortness of breath.   Cardiovascular: Negative for chest pain and leg swelling.  Gastrointestinal: Negative for abdominal distention, abdominal pain, diarrhea, nausea and vomiting.  Genitourinary: Positive for discharge and penile pain. Negative for difficulty urinating, dysuria, scrotal swelling, testicular pain and urgency.  Musculoskeletal: Negative for arthralgias, back pain and gait problem.  Skin: Negative for color change and rash.  Neurological: Negative for dizziness and headaches.  Hematological: Negative for adenopathy.  Psychiatric/Behavioral: Negative for agitation and behavioral problems.     Physical Exam Updated Vital Signs BP (!) 128/96 (BP Location: Left Arm)   Pulse (!) 106   Temp 98.7 F (37.1 C) (Oral)   Resp 16   Ht 5\' 7"  (1.702 m)   Wt 104.3 kg   SpO2 98%   BMI 36.02 kg/m   Physical Exam  Constitutional: He appears well-developed and well-nourished.  HENT:  Head: Normocephalic and atraumatic.  Eyes: Conjunctivae are normal.  Neck: Neck supple.  Cardiovascular: Normal rate and regular rhythm.   No murmur heard. Pulmonary/Chest: Effort normal and breath sounds normal. No respiratory distress.  Abdominal: Soft. He exhibits no distension. There is no tenderness.  Genitourinary: Testes normal and penis normal. Right testis  shows no mass, no swelling and no tenderness. Left testis shows no mass, no swelling and no tenderness. Circumcised. No penile erythema or penile tenderness. No discharge found.  Genitourinary Comments: No rash throughout the shaft or head of the penis  Musculoskeletal: He exhibits no edema.  Neurological: He is alert.  Skin: Skin is warm and dry.  Psychiatric: He has a normal mood and affect.  Nursing  note and vitals reviewed.    ED Treatments / Results  Labs (all labs ordered are listed, but only abnormal results are displayed) Labs Reviewed  URINALYSIS COMPLETEWITH MICROSCOPIC ([facility] ONLY) - Abnormal; Notable for the following:       Result Value   Color, Urine YELLOW (*)    APPearance CLEAR (*)    All other components within normal limits  CHLAMYDIA/NGC RT PCR [facility] ONLY)    EKG  EKG Interpretation None       Radiology No results found.  Procedures Procedures (including critical care time)  Medications Ordered in ED Medications  cefTRIAXone (ROCEPHIN) injection 500 mg (500 mg Intramuscular Given 04/05/16 2137)  azithromycin (ZITHROMAX) tablet 1,000 mg (1,000 mg Oral Given 04/05/16 2136)     Initial Impression / Assessment and Plan / ED Course  I have reviewed the triage vital signs and the nursing notes.  Pertinent labs & imaging results that were available during my care of the patient were reviewed by me and considered in my medical decision making (see chart for details).  Clinical Course    25 year old male with urethritis. Unprotected sex 5 days ago with new partner. Patient is treated with ceftriaxone 500 mg IM 1, azithromycin 1000 mg by mouth 1. Gonorrhea chlamydia test is pending. Patient is educated on signs and symptoms to return to the emergency department for. He will await results. Notify sexual partner.  Final Clinical Impressions(s) / ED Diagnoses   Final diagnoses:  Urethritis    New Prescriptions New Prescriptions   No medications on file     Evon Slack, PA-C 04/05/16 2149    Loleta Rose, MD 04/05/16 [phone]

## 2016-04-05 NOTE — Discharge Instructions (Signed)
Return to the ER for any worsening symptoms or urgent changes her health.

## 2016-04-05 NOTE — ED Triage Notes (Signed)
Pt presents to ED c/o of burning sensation in penis since Monday. Admits to intermittent discharge.

## 2016-05-23 ENCOUNTER — Encounter: Payer: Self-pay | Admitting: Emergency Medicine

## 2016-05-23 ENCOUNTER — Emergency Department
Admission: EM | Admit: 2016-05-23 | Discharge: 2016-05-23 | Disposition: A | Discharge status: Home / Self Care | Payer: Self-pay | Attending: Emergency Medicine | Admitting: Emergency Medicine

## 2016-05-23 ENCOUNTER — Emergency Department: Payer: Self-pay

## 2016-05-23 DIAGNOSIS — F17210 Nicotine dependence, cigarettes, uncomplicated: Secondary | ICD-10-CM | POA: Insufficient documentation

## 2016-05-23 DIAGNOSIS — N5089 Other specified disorders of the male genital organs: Secondary | ICD-10-CM

## 2016-05-23 DIAGNOSIS — R1031 Right lower quadrant pain: Secondary | ICD-10-CM

## 2016-05-23 LAB — URINALYSIS, COMPLETE (UACMP) WITH MICROSCOPIC
BILIRUBIN URINE: NEGATIVE
Bacteria, UA: NONE SEEN
GLUCOSE, UA: NEGATIVE mg/dL
Hgb urine dipstick: NEGATIVE
KETONES UR: NEGATIVE mg/dL
LEUKOCYTES UA: NEGATIVE
Nitrite: NEGATIVE
PH: 6 (ref 5.0–8.0)
Protein, ur: NEGATIVE mg/dL
Specific Gravity, Urine: 1.023 (ref 1.005–1.030)
Squamous Epithelial / LPF: NONE SEEN

## 2016-05-23 NOTE — ED Triage Notes (Addendum)
Pt ambulatory to triage with steady gait, no distress noted. Pt c/o of right testicle swelling and cramping above the area of swelling. Pt reports such symptoms started x3 days ago.

## 2016-05-23 NOTE — ED Provider Notes (Signed)
[facility] Emergency Department Provider Note   First MD Initiated Contact with Patient 05/23/16 0123     (approximate)  I have reviewed the triage vital signs and the nursing notes.   HISTORY  Chief Complaint Groin Swelling    HPI Tony Lowery is a 25 y.o. male presents with intermittent right groin/right scrotum pain 3 days. Patient states no pain at present 0 out of 10. Patient denies any nausea no vomiting diarrhea. Patient denies any urinary symptoms. Patient denies any fever   Past medical history No pertinent past medical history There are no active problems to display for this patient.   Past surgical history None  Prior to Admission medications   Medication Sig Start Date End Date Taking? Authorizing Provider  amoxicillin (AMOXIL) 500 MG capsule Take 1 capsule (500 mg total) by mouth 3 (three) times daily. 01/24/16   Joni Reiningonald K Smith, PA-C  ibuprofen (ADVIL,MOTRIN) 600 MG tablet Take 1 tablet (600 mg total) by mouth every 8 (eight) hours as needed. 01/24/16   Joni Reiningonald K Smith, PA-C  lidocaine (XYLOCAINE) 2 % solution Use as directed 5 mLs in the mouth or throat every 6 (six) hours as needed for mouth pain. 01/24/16   Joni Reiningonald K Smith, PA-C  traMADol (ULTRAM) 50 MG tablet Take 1 tablet (50 mg total) by mouth every 6 (six) hours as needed. 01/24/16 01/23/17  Joni Reiningonald K Smith, PA-C    Allergies Amoxicillin  History reviewed. No pertinent family history.  Social History Social History  Substance Use Topics  . Smoking status: Current Every Day Smoker    Packs/day: 1.00    Types: Cigarettes  . Smokeless tobacco: Never Used  . Alcohol use Yes     Comment: rarely    Review of Systems Constitutional: No fever/chills Eyes: No visual changes. ENT: No sore throat. Cardiovascular: Denies chest pain. Respiratory: Denies shortness of breath. Gastrointestinal: No abdominal pain.  No nausea, no vomiting.  No diarrhea.  No constipation.Positive for right  groin pain (now resolved) Genitourinary: Negative for dysuria. Musculoskeletal: Negative for back pain. Skin: Negative for rash. Neurological: Negative for headaches, focal weakness or numbness.  10-point ROS otherwise negative.  ____________________________________________   PHYSICAL EXAM:  VITAL SIGNS: ED Triage Vitals  Enc Vitals Group     BP 05/23/16 0040 (!) 155/97     Pulse Rate 05/23/16 0040 100     Resp 05/23/16 0040 15     Temp 05/23/16 0040 98 F (36.7 C)     Temp Source 05/23/16 0040 Oral     SpO2 05/23/16 0040 100 %     Weight 05/23/16 0040 235 lb (106.6 kg)     Height 05/23/16 0040 5\' 7"  (1.702 m)     Head Circumference --      Peak Flow --      Pain Score 05/23/16 0041 0     Pain Loc --      Pain Edu? --      Excl. in GC? --     Constitutional: Alert and oriented. Well appearing and in no acute distress. Eyes: Conjunctivae are normal. PERRL. EOMI. Head: Atraumatic. Mouth/Throat: Mucous membranes are moist.  Oropharynx non-erythematous. Neck: No stridor.    Cardiovascular: Normal rate, regular rhythm. Good peripheral circulation. Grossly normal heart sounds. Respiratory: Normal respiratory effort.  No retractions. Lungs CTAB. Gastrointestinal: Soft and nontender. No distention.  Musculoskeletal: No lower extremity tenderness nor edema. No gross deformities of extremities. Neurologic:  Normal speech and language. No gross  focal neurologic deficits are appreciated.  Skin:  Skin is warm, dry and intact. No rash noted. Psychiatric: Mood and affect are normal. Speech and behavior are normal.  ____________________________________________   LABS (all labs ordered are listed, but only abnormal results are displayed)  Labs Reviewed  URINALYSIS, COMPLETE (UACMP) WITH MICROSCOPIC    RADIOLOGY I, [place] N Jillann Charette, personally viewed and evaluated these images (plain radiographs) as part of my medical decision making, as well as reviewing the written report by  the radiologist.  [place] Scrotum  Result Date: 05/23/2016 CLINICAL DATA:  Bilateral scrotal swelling for 3 or 4 days EXAM: SCROTAL ULTRASOUND DOPPLER ULTRASOUND OF THE TESTICLES TECHNIQUE: Complete ultrasound examination of the testicles, epididymis, and other scrotal structures was performed. Color and spectral Doppler ultrasound were also utilized to evaluate blood flow to the testicles. COMPARISON:  None. FINDINGS: Right testicle Measurements: 4.6 x 2.2 x 3.4 cm. No mass or microlithiasis visualized. Left testicle Measurements: 4.3 x 2.1 x 2.9 cm. No mass or microlithiasis visualized. Right epididymis:  Normal in size and appearance. Left epididymis:  Normal in size and appearance. Hydrocele:  None visualized. Varicocele:  None visualized. Pulsed Doppler interrogation of both testes demonstrates normal low resistance arterial and venous waveforms bilaterally. IMPRESSION: Normal scrotal contents. No hydrocele. No testicular mass or torsion. Electronically Signed   By: Ellery Plunkaniel R Mitchell M.D.   On: 05/23/2016 01:58   [place] Art/ven Flow Abd Pelv Doppler  Result Date: 05/23/2016 CLINICAL DATA:  Bilateral scrotal swelling for 3 or 4 days EXAM: SCROTAL ULTRASOUND DOPPLER ULTRASOUND OF THE TESTICLES TECHNIQUE: Complete ultrasound examination of the testicles, epididymis, and other scrotal structures was performed. Color and spectral Doppler ultrasound were also utilized to evaluate blood flow to the testicles. COMPARISON:  None. FINDINGS: Right testicle Measurements: 4.6 x 2.2 x 3.4 cm. No mass or microlithiasis visualized. Left testicle Measurements: 4.3 x 2.1 x 2.9 cm. No mass or microlithiasis visualized. Right epididymis:  Normal in size and appearance. Left epididymis:  Normal in size and appearance. Hydrocele:  None visualized. Varicocele:  None visualized. Pulsed Doppler interrogation of both testes demonstrates normal low resistance arterial and venous waveforms bilaterally. IMPRESSION: Normal scrotal contents.  No hydrocele. No testicular mass or torsion. Electronically Signed   By: Ellery Plunkaniel R Mitchell M.D.   On: 05/23/2016 01:58    Procedures      INITIAL IMPRESSION / ASSESSMENT AND PLAN / ED COURSE  Pertinent labs & imaging results that were available during my care of the patient were reviewed by me and considered in my medical decision making (see chart for details).  No clear etiology for the patient's intermittent right groin pain that he does not have at present. Considered possibility of inguinal hernia none noted on ultrasound also consider possibility of scrotal etiology namely epididymitis testicular torsion varicocele however none noted.   Clinical Course     ____________________________________________  FINAL CLINICAL IMPRESSION(S) / ED DIAGNOSES  Final diagnoses:  Swelling of left testicle     MEDICATIONS GIVEN DURING THIS VISIT:  Medications - No data to display   NEW OUTPATIENT MEDICATIONS STARTED DURING THIS VISIT:  New Prescriptions   No medications on file    Modified Medications   No medications on file    Discontinued Medications   No medications on file     Note:  This document was prepared using Dragon voice recognition software and may include unintentional dictation errors.    Darci Currentandolph N Mayar Whittier, MD 05/23/16 [phone]

## 2016-05-23 NOTE — ED Notes (Signed)
Patient transported to Ultrasound 

## 2016-05-23 NOTE — ED Notes (Addendum)
Pt c/o "cramping" to R testicle.  Denies injury, urinary s/s, drainage, odor, or pain at this time. Sts that nothing precipitates pain, pain intermittent and that sometimes there will be a "bluge that pops right back in".  No swelling, redness or tenderness to area.  NAD.

## 2016-07-01 ENCOUNTER — Emergency Department
Admission: EM | Admit: 2016-07-01 | Discharge: 2016-07-02 | Disposition: A | Discharge status: Home / Self Care | Payer: Self-pay | Attending: Emergency Medicine | Admitting: Emergency Medicine

## 2016-07-01 ENCOUNTER — Encounter: Payer: Self-pay | Admitting: Emergency Medicine

## 2016-07-01 DIAGNOSIS — F17210 Nicotine dependence, cigarettes, uncomplicated: Secondary | ICD-10-CM | POA: Insufficient documentation

## 2016-07-01 DIAGNOSIS — J111 Influenza due to unidentified influenza virus with other respiratory manifestations: Secondary | ICD-10-CM | POA: Insufficient documentation

## 2016-07-01 HISTORY — DX: Cardiac arrhythmia, unspecified: I49.9

## 2016-07-01 LAB — INFLUENZA PANEL BY PCR (TYPE A & B)
INFLBPCR: NEGATIVE
Influenza A By PCR: POSITIVE — AB

## 2016-07-01 NOTE — ED Notes (Signed)
This RN to bedside at this time. Pt noted to staring out the window. Pt states "I can't breathe with this mask on". This RN instructed patient that while he was in the room he could take his mask off. Pt states, "I felt like I was going to faint". This RN instructed patient to lie on the bed at this time. Pt is noted to be flushed.

## 2016-07-01 NOTE — ED Notes (Signed)
Pt states symptoms started on Wednesday, states has had intermittent nausea, vomiting, diarrhea, and chills. Pt states unknown max temp at home due to not having thermometer. Pt presents to ED with c/o fever today, states feels like "my head is on fire". Pt is alert and oriented NAD noted at this time. Respirations even and unlabored.

## 2016-07-01 NOTE — ED Triage Notes (Signed)
Pt ambulatory to triage with steady gait with c/o possible fever and headache. Pt states since Wednesday he has had cough and chills but reports symptoms have subsided. Pt reports taking Robitussin around 5 pm. Pt afebrile in triage.

## 2016-07-02 MED ORDER — LOPERAMIDE HCL 2 MG PO TABS: 2.0000 mg | ORAL_TABLET | Freq: Four times a day (QID) | ORAL | 0 refills | Status: DC | PRN

## 2016-07-02 NOTE — ED Notes (Signed)
NAD noted at time of D/C. Pt denies questions or concerns. Pt ambulatory to the lobby at this time.  

## 2016-07-02 NOTE — ED Provider Notes (Signed)
[facility] Emergency Department Provider Note  ____________________________________________  Time seen: Approximately 12:39 AM  I have reviewed the triage vital signs and the nursing notes.   HISTORY  Chief Complaint Fever    HPI Edwinna Areoladgar Guardia is a 26 y.o. male who presents to emergency department complaining of multiple medical complaints. Patient states that over the past 5 days he has had high fevers, chills, headache, nausea, emesis, diarrhea. Patient states that he has been trying Tylenol, plenty of fluids, Robitussin for his complaints. He reports that symptoms seem to be abating at this time. Patient states that he has felt a little lightheaded at times. He denies any frank headache, neck pain, visual changes, chest pain, shortness of breath. Patient states that currently his fever has improved, he is no longer nauseated and has had no emesis today. He reports that he continues to have diarrhea.   Past Medical History:  Diagnosis Date  . Irregular heartbeat     There are no active problems to display for this patient.   History reviewed. No pertinent surgical history.  Prior to Admission medications   Medication Sig Start Date End Date Taking? Authorizing Provider  amoxicillin (AMOXIL) 500 MG capsule Take 1 capsule (500 mg total) by mouth 3 (three) times daily. 01/24/16   Joni Reiningonald K Smith, PA-C  ibuprofen (ADVIL,MOTRIN) 600 MG tablet Take 1 tablet (600 mg total) by mouth every 8 (eight) hours as needed. 01/24/16   Joni Reiningonald K Smith, PA-C  lidocaine (XYLOCAINE) 2 % solution Use as directed 5 mLs in the mouth or throat every 6 (six) hours as needed for mouth pain. 01/24/16   Joni Reiningonald K Smith, PA-C  loperamide (IMODIUM A-D) 2 MG tablet Take 1 tablet (2 mg total) by mouth 4 (four) times daily as needed for diarrhea or loose stools. 07/02/16   Delorise RoyalsJonathan D Krishay Faro, PA-C  traMADol (ULTRAM) 50 MG tablet Take 1 tablet (50 mg total) by mouth every 6 (six) hours as needed.  01/24/16 01/23/17  Joni Reiningonald K Smith, PA-C    Allergies Amoxicillin  No family history on file.  Social History Social History  Substance Use Topics  . Smoking status: Current Every Day Smoker    Packs/day: 1.00    Types: Cigarettes  . Smokeless tobacco: Never Used  . Alcohol use Yes     Comment: rarely     Review of Systems  Constitutional: Positive fever/chills Eyes: No visual changes. No discharge ENT: No upper respiratory complaints. Cardiovascular: no chest pain. Respiratory: no cough. No SOB. Gastrointestinal: No abdominal pain.  [place] for nausea and vomiting.Marland Kitchen.  Positive diarrhea.  No constipation. Musculoskeletal: Negative for musculoskeletal pain. Skin: Negative for rash, abrasions, lacerations, ecchymosis. Neurological: Negative for headaches, focal weakness or numbness. Reports feeling somewhat lightheaded. 10-point ROS otherwise negative.  ____________________________________________   PHYSICAL EXAM:  VITAL SIGNS: ED Triage Vitals  Enc Vitals Group     BP 07/01/16 2129 130/87     Pulse Rate 07/01/16 2129 97     Resp 07/01/16 2129 18     Temp 07/01/16 2129 99.2 F (37.3 C)     Temp Source 07/01/16 2129 Oral     SpO2 07/01/16 2129 100 %     Weight 07/01/16 2137 230 lb (104.3 kg)     Height 07/01/16 2137 5\' 6"  (1.676 m)     Head Circumference --      Peak Flow --      Pain Score 07/01/16 2137 4     Pain Loc --  Pain Edu? --      Excl. in GC? --      Constitutional: Alert and oriented. Well appearing and in no acute distress. Eyes: Conjunctivae are normal. PERRL. EOMI. Head: Atraumatic. ENT:      Ears:       Nose: No congestion/rhinnorhea.      Mouth/Throat: Mucous membranes are moist.  Neck: No stridor. Neck is supple with full range of motion Hematological/Lymphatic/Immunilogical: No cervical lymphadenopathy. Cardiovascular: Normal rate, regular rhythm. Normal S1 and S2.  Good peripheral circulation. Respiratory: Normal respiratory effort  without tachypnea or retractions. Lungs CTAB. Good air entry to the bases with no decreased or absent breath sounds. Gastrointestinal: Bowel sounds 4 quadrants. Soft and nontender to palpation. No guarding or rigidity. No palpable masses. No distention.  Musculoskeletal: Full range of motion to all extremities. No gross deformities appreciated. Neurologic:  Normal speech and language. No gross focal neurologic deficits are appreciated. Cranial nerves II through XII grossly intact. Skin:  Skin is warm, dry and intact. No rash noted. Psychiatric: Mood and affect are normal. Speech and behavior are normal. Patient exhibits appropriate insight and judgement.   ____________________________________________   LABS (all labs ordered are listed, but only abnormal results are displayed)  Labs Reviewed  INFLUENZA PANEL BY PCR (TYPE A & B) - Abnormal; Notable for the following:       Result Value   Influenza A By PCR POSITIVE (*)    All other components within normal limits   ____________________________________________  EKG   ____________________________________________  RADIOLOGY   No results found.  ____________________________________________    PROCEDURES  Procedure(s) performed:    Procedures    Medications - No data to display   ____________________________________________   INITIAL IMPRESSION / ASSESSMENT AND PLAN / ED COURSE  Pertinent labs & imaging results that were available during my care of the patient were reviewed by me and considered in my medical decision making (see chart for details).  Review of the [place] CSRS was performed in accordance of the [facility] prior to dispensing any controlled drugs.  Clinical Course     Patient's diagnosis is consistent with Influenza a. Patient reports that he has had symptoms for the past 5 days. Patient reports that symptoms seem to be improving at this time. Today patient felt a little lightheaded and presented to the  emergency department. Currently, patient has no headache, fevers, nausea, emesis. Patient's exam is reassuring with moist mucous membranes, and neurologically intact, no abdominal pain. As such, no further labs or imaging are deemed necessary. Symptoms are consistent with influenza infection.. No medications at this time as patient is improving with symptoms. She will follow up with primary care as needed. Patient is given ED precautions to return to the ED for any worsening or new symptoms.     ____________________________________________  FINAL CLINICAL IMPRESSION(S) / ED DIAGNOSES  Final diagnoses:  Influenza      NEW MEDICATIONS STARTED DURING THIS VISIT:  Discharge Medication List as of 07/02/2016 12:40 AM          This chart was dictated using voice recognition software/Dragon. Despite best efforts to proofread, errors can occur which can change the meaning. Any change was purely unintentional.    Racheal Patches, PA-C 07/02/16 0124    Myrna Blazer, MD 07/03/16 [phone]

## 2017-04-21 ENCOUNTER — Emergency Department
Admission: EM | Admit: 2017-04-21 | Discharge: 2017-04-21 | Disposition: A | Discharge status: Home / Self Care | Payer: Self-pay | Attending: Emergency Medicine | Admitting: Emergency Medicine

## 2017-04-21 ENCOUNTER — Encounter: Payer: Self-pay | Admitting: *Deleted

## 2017-04-21 ENCOUNTER — Other Ambulatory Visit: Payer: Self-pay

## 2017-04-21 DIAGNOSIS — F17210 Nicotine dependence, cigarettes, uncomplicated: Secondary | ICD-10-CM | POA: Insufficient documentation

## 2017-04-21 DIAGNOSIS — S39012A Strain of muscle, fascia and tendon of lower back, initial encounter: Secondary | ICD-10-CM | POA: Insufficient documentation

## 2017-04-21 DIAGNOSIS — Y929 Unspecified place or not applicable: Secondary | ICD-10-CM | POA: Insufficient documentation

## 2017-04-21 DIAGNOSIS — Y939 Activity, unspecified: Secondary | ICD-10-CM | POA: Insufficient documentation

## 2017-04-21 DIAGNOSIS — Y33XXXA Other specified events, undetermined intent, initial encounter: Secondary | ICD-10-CM | POA: Insufficient documentation

## 2017-04-21 DIAGNOSIS — Y999 Unspecified external cause status: Secondary | ICD-10-CM | POA: Insufficient documentation

## 2017-04-21 DIAGNOSIS — Z880 Allergy status to penicillin: Secondary | ICD-10-CM | POA: Insufficient documentation

## 2017-04-21 LAB — URINALYSIS, COMPLETE (UACMP) WITH MICROSCOPIC
BACTERIA UA: NONE SEEN
BILIRUBIN URINE: NEGATIVE
Glucose, UA: NEGATIVE mg/dL
HGB URINE DIPSTICK: NEGATIVE
Ketones, ur: NEGATIVE mg/dL
LEUKOCYTES UA: NEGATIVE
NITRITE: NEGATIVE
Protein, ur: NEGATIVE mg/dL
SPECIFIC GRAVITY, URINE: 1.026 (ref 1.005–1.030)
pH: 5 (ref 5.0–8.0)

## 2017-04-21 MED ORDER — METHOCARBAMOL 500 MG PO TABS: 500.0000 mg | ORAL_TABLET | Freq: Four times a day (QID) | ORAL | 0 refills | Status: DC

## 2017-04-21 MED ORDER — MELOXICAM 15 MG PO TABS: 15.0000 mg | ORAL_TABLET | Freq: Every day | ORAL | 0 refills | Status: DC

## 2017-04-21 NOTE — ED Provider Notes (Signed)
[facility] Emergency Department Provider Note  ____________________________________________  Time seen: Approximately 6:23 PM  I have reviewed the triage vital signs and the nursing notes.   HISTORY  Chief Complaint Back Pain    HPI Tony Lowery is a 26 y.o. male since the emergency department complaining of left-sided back pain.  Patient reports that he does have a history of intermittent back problems from a previous injury.  Patient denies any trauma prior to the start of this episode of pain.  Patient denies any urinary symptoms to include polyuria, dysuria, hematuria.  Pain is on the left side.  No radicular symptoms.  No bowel or bladder dysfunction, saddle anesthesia, paresthesias.  No other complaints at this time.  No medications prior to arrival.  Past Medical History:  Diagnosis Date  . Irregular heartbeat     There are no active problems to display for this patient.   History reviewed. No pertinent surgical history.  Prior to Admission medications   Medication Sig Start Date End Date Taking? Authorizing Provider  amoxicillin (AMOXIL) 500 MG capsule Take 1 capsule (500 mg total) by mouth 3 (three) times daily. 01/24/16   Joni ReiningSmith, Ronald K, PA-C  ibuprofen (ADVIL,MOTRIN) 600 MG tablet Take 1 tablet (600 mg total) by mouth every 8 (eight) hours as needed. 01/24/16   Joni ReiningSmith, Ronald K, PA-C  lidocaine (XYLOCAINE) 2 % solution Use as directed 5 mLs in the mouth or throat every 6 (six) hours as needed for mouth pain. 01/24/16   Joni ReiningSmith, Ronald K, PA-C  loperamide (IMODIUM A-D) 2 MG tablet Take 1 tablet (2 mg total) by mouth 4 (four) times daily as needed for diarrhea or loose stools. 07/02/16   Cuthriell, Delorise RoyalsJonathan D, PA-C  meloxicam (MOBIC) 15 MG tablet Take 1 tablet (15 mg total) daily by mouth. 04/21/17   Cuthriell, Delorise RoyalsJonathan D, PA-C  methocarbamol (ROBAXIN) 500 MG tablet Take 1 tablet (500 mg total) 4 (four) times daily by mouth. 04/21/17   Cuthriell, Delorise RoyalsJonathan D,  PA-C    Allergies Amoxicillin  No family history on file.  Social History Social History   Tobacco Use  . Smoking status: Current Every Day Smoker    Packs/day: 1.00    Types: Cigarettes  . Smokeless tobacco: Never Used  Substance Use Topics  . Alcohol use: Yes    Comment: rarely  . Drug use: Not on file     Review of Systems  Constitutional: No fever/chills Eyes: No visual changes. No discharge ENT: No upper respiratory complaints. Cardiovascular: no chest pain. Respiratory: no cough. No SOB. Gastrointestinal: No abdominal pain.  No nausea, no vomiting.  No diarrhea.  No constipation. Genitourinary: Negative for dysuria. No hematuria Musculoskeletal: Positive for left lower back pain Skin: Negative for rash, abrasions, lacerations, ecchymosis. Neurological: Negative for headaches, focal weakness or numbness. 10-point ROS otherwise negative.  ____________________________________________   PHYSICAL EXAM:  VITAL SIGNS: ED Triage Vitals [04/21/17 1637]  Enc Vitals Group     BP 133/84     Pulse Rate 85     Resp 20     Temp 98.9 F (37.2 C)     Temp Source Oral     SpO2 99 %     Weight 235 lb (106.6 kg)     Height 5\' 6"  (1.676 m)     Head Circumference      Peak Flow      Pain Score 8     Pain Loc      Pain Edu?  Excl. in GC?      Constitutional: Alert and oriented. Well appearing and in no acute distress. Eyes: Conjunctivae are normal. PERRL. EOMI. Head: Atraumatic. ENT:      Ears:       Nose: No congestion/rhinnorhea.      Mouth/Throat: Mucous membranes are moist.  Neck: No stridor.    Cardiovascular: Normal rate, regular rhythm. Normal S1 and S2.  Good peripheral circulation. Respiratory: Normal respiratory effort without tachypnea or retractions. Lungs CTAB. Good air entry to the bases with no decreased or absent breath sounds. Gastrointestinal: Bowel sounds 4 quadrants. Soft and nontender to palpation. No guarding or rigidity. No palpable  masses. No distention. No CVA tenderness. Musculoskeletal: Full range of motion to all extremities. No gross deformities appreciated.  No deformities to spot upon inspection.  Full range of motion to the lumbar spine.  Nontender palpation over the osseous structures.  No palpable abnormality or step-off.  Patient is diffusely tender to palpation throughout the lumbar paraspinal muscle group with spasms appreciated.  No tenderness to palpation of her bilateral sciatic notches.  Negative straight leg raise bilaterally.  Dorsalis pedis pulse intact bilateral lower extremities.  Sensation intact and equal bilateral lower extremities. Neurologic:  Normal speech and language. No gross focal neurologic deficits are appreciated.  Skin:  Skin is warm, dry and intact. No rash noted. Psychiatric: Mood and affect are normal. Speech and behavior are normal. Patient exhibits appropriate insight and judgement.   ____________________________________________   LABS (all labs ordered are listed, but only abnormal results are displayed)  Labs Reviewed  URINALYSIS, COMPLETE (UACMP) WITH MICROSCOPIC - Abnormal; Notable for the following components:      Result Value   Color, Urine YELLOW (*)    APPearance CLEAR (*)    Squamous Epithelial / LPF 0-5 (*)    All other components within normal limits   ____________________________________________  EKG   ____________________________________________  RADIOLOGY   No results found.  ____________________________________________    PROCEDURES  Procedure(s) performed:    Procedures    Medications - No data to display   ____________________________________________   INITIAL IMPRESSION / ASSESSMENT AND PLAN / ED COURSE  Pertinent labs & imaging results that were available during my care of the patient were reviewed by me and considered in my medical decision making (see chart for details).  Review of the [place] CSRS was performed in accordance of the  [facility] prior to dispensing any controlled drugs.     Patient's diagnosis is consistent with lumbar strain.  Differential included nephrolithiasis, UTI, pyelonephritis, compression fracture, lumbar strain, sciatica.  At this time, exam is reassuring with no indication for imaging.  Urinalysis returns with no signs of infection or hematuria consistent with kidney stones.. Patient will be discharged home with prescriptions for anti-inflammatory muscle relaxer for symptom control. Patient is to follow up with primary care as needed or otherwise directed. Patient is given ED precautions to return to the ED for any worsening or new symptoms.     ____________________________________________  FINAL CLINICAL IMPRESSION(S) / ED DIAGNOSES  Final diagnoses:  Strain of lumbar region, initial encounter      NEW MEDICATIONS STARTED DURING THIS VISIT:  Meloxicam, 15 mg daily. Robaxin 500 mg 4 times daily as needed      This chart was dictated using voice recognition software/Dragon. Despite best efforts to proofread, errors can occur which can change the meaning. Any change was purely unintentional.    Racheal Patches, PA-C 04/21/17 1837  Myrna Blazer, MD 04/21/17 [phone]

## 2017-04-21 NOTE — ED Triage Notes (Signed)
Pt complains of left sided lumbar pain worse with movement , starting when pt was at work today, pt denies any other medical problems

## 2017-07-26 ENCOUNTER — Encounter: Payer: Self-pay | Admitting: Emergency Medicine

## 2017-07-26 ENCOUNTER — Emergency Department
Admission: EM | Admit: 2017-07-26 | Discharge: 2017-07-26 | Disposition: A | Discharge status: Home / Self Care | Payer: Self-pay | Attending: Emergency Medicine | Admitting: Emergency Medicine

## 2017-07-26 DIAGNOSIS — H6502 Acute serous otitis media, left ear: Secondary | ICD-10-CM | POA: Insufficient documentation

## 2017-07-26 DIAGNOSIS — H9202 Otalgia, left ear: Secondary | ICD-10-CM | POA: Insufficient documentation

## 2017-07-26 DIAGNOSIS — Z79899 Other long term (current) drug therapy: Secondary | ICD-10-CM | POA: Insufficient documentation

## 2017-07-26 DIAGNOSIS — F17210 Nicotine dependence, cigarettes, uncomplicated: Secondary | ICD-10-CM | POA: Insufficient documentation

## 2017-07-26 MED ORDER — AZITHROMYCIN 500 MG PO TABS
500.0000 mg | ORAL_TABLET | Freq: Once | ORAL | Status: AC
  Administered 2017-07-26: 500 mg via ORAL
  Filled 2017-07-26: qty 1

## 2017-07-26 MED ORDER — AZITHROMYCIN 250 MG PO TABS: 250.0000 mg | ORAL_TABLET | Freq: Every day | ORAL | 0 refills | Status: DC

## 2017-07-26 MED ORDER — LIDOCAINE HCL (PF) 1 % IJ SOLN
2.0000 mL | Freq: Once | INTRAMUSCULAR | Status: DC
  Filled 2017-07-26: qty 5

## 2017-07-26 NOTE — Discharge Instructions (Signed)
1. Finish antibiotic as prescribed (Azithromycin 250mg  daily x 4 days). 2. Return to the ER for worsening symptoms, persistent vomiting, difficulty breathing or other concerns.

## 2017-07-26 NOTE — ED Triage Notes (Signed)
Patient with complaint of left ear pain that started today. Patient states that his ear feels "clogged".

## 2017-07-26 NOTE — ED Notes (Signed)
Pt to the er for left ear pain. Pt has pain and popping in the left ear which is worse than normal. Right ear is occluded with wax. Left ear is red.

## 2017-07-26 NOTE — ED Provider Notes (Signed)
[facility] Emergency Department Provider Note   ____________________________________________   First MD Initiated Contact with Patient 07/26/17 [id number]     (approximate)  I have reviewed the triage vital signs and the nursing notes.   HISTORY  Chief Complaint Otalgia    HPI Tony Lowery is a 27 y.o. male who presents to the ED from work with a chief complaint of left ear pain.  Patient had cold-like symptoms last week.  Tonight began with left ear pain.  Tried cleaning his ear with a Q-tip at work and feels like he made things worse.  Complains of diminished hearing.  Denies discharge.  Denies fever, chills, chest pain, shortness of breath, abdominal pain, nausea or vomiting.  Denies recent swimming, barotrauma, trauma.   Past Medical History:  Diagnosis Date  . Irregular heartbeat     There are no active problems to display for this patient.   History reviewed. No pertinent surgical history.  Prior to Admission medications   Medication Sig Start Date End Date Taking? Authorizing Provider  amoxicillin (AMOXIL) 500 MG capsule Take 1 capsule (500 mg total) by mouth 3 (three) times daily. 01/24/16   Joni ReiningSmith, Ronald K, PA-C  azithromycin (ZITHROMAX) 250 MG tablet Take 1 tablet (250 mg total) by mouth daily. 07/26/17   Irean HongSung, Devika Dragovich J, MD  ibuprofen (ADVIL,MOTRIN) 600 MG tablet Take 1 tablet (600 mg total) by mouth every 8 (eight) hours as needed. 01/24/16   Joni ReiningSmith, Ronald K, PA-C  lidocaine (XYLOCAINE) 2 % solution Use as directed 5 mLs in the mouth or throat every 6 (six) hours as needed for mouth pain. 01/24/16   Joni ReiningSmith, Ronald K, PA-C  loperamide (IMODIUM A-D) 2 MG tablet Take 1 tablet (2 mg total) by mouth 4 (four) times daily as needed for diarrhea or loose stools. 07/02/16   Cuthriell, Delorise RoyalsJonathan D, PA-C  meloxicam (MOBIC) 15 MG tablet Take 1 tablet (15 mg total) daily by mouth. 04/21/17   Cuthriell, Delorise RoyalsJonathan D, PA-C  methocarbamol (ROBAXIN) 500 MG tablet Take 1 tablet  (500 mg total) 4 (four) times daily by mouth. 04/21/17   Cuthriell, Delorise RoyalsJonathan D, PA-C    Allergies Amoxicillin  No family history on file.  Social History Social History   Tobacco Use  . Smoking status: Current Every Day Smoker    Packs/day: 1.00    Types: Cigarettes  . Smokeless tobacco: Never Used  Substance Use Topics  . Alcohol use: No    Frequency: Never  . Drug use: Not on file    Review of Systems  Constitutional: No fever/chills. Eyes: No visual changes. ENT: Positive for left ear pain.  No sore throat. Cardiovascular: Denies chest pain. Respiratory: Denies shortness of breath. Gastrointestinal: No abdominal pain.  No nausea, no vomiting.  No diarrhea.  No constipation. Genitourinary: Negative for dysuria. Musculoskeletal: Negative for back pain. Skin: Negative for rash. Neurological: Negative for headaches, focal weakness or numbness.   ____________________________________________   PHYSICAL EXAM:  VITAL SIGNS: ED Triage Vitals  Enc Vitals Group     BP 07/26/17 0037 138/88     Pulse Rate 07/26/17 0037 (!) 115     Resp 07/26/17 0037 18     Temp 07/26/17 0037 98.2 F (36.8 C)     Temp Source 07/26/17 0037 Oral     SpO2 07/26/17 0037 98 %     Weight 07/26/17 0038 240 lb (108.9 kg)     Height 07/26/17 0038 5\' 7"  (1.702 m)  Head Circumference --      Peak Flow --      Pain Score --      Pain Loc --      Pain Edu? --      Excl. in GC? --     Constitutional: Alert and oriented. Well appearing and in no acute distress. Eyes: Conjunctivae are normal. PERRL. EOMI. Head: Atraumatic. Ears: Right TM with some cerumen.  Left TM erythematous, bulging without perforation. Nose: No congestion/rhinnorhea. Mouth/Throat: Mucous membranes are moist.  Oropharynx non-erythematous. Neck: No stridor.   Hematological/Lymphatic/Immunilogical: Small left posterior auricular lymphadenopathy. Cardiovascular: Normal rate, regular rhythm. Grossly normal heart sounds.   Good peripheral circulation. Respiratory: Normal respiratory effort.  No retractions. Lungs CTAB. Gastrointestinal: Soft and nontender. No distention. No abdominal bruits. No CVA tenderness. Musculoskeletal: No lower extremity tenderness nor edema.  No joint effusions. Neurologic:  Normal speech and language. No gross focal neurologic deficits are appreciated. No gait instability. Skin:  Skin is warm, dry and intact. No rash noted. Psychiatric: Mood and affect are normal. Speech and behavior are normal.  ____________________________________________   LABS (all labs ordered are listed, but only abnormal results are displayed)  Labs Reviewed - No data to display ____________________________________________  EKG  None ____________________________________________  RADIOLOGY  ED MD interpretation: None  Official radiology report(s): No results found.  ____________________________________________   PROCEDURES  Procedure(s) performed: None  Procedures  Critical Care performed: No  ____________________________________________   INITIAL IMPRESSION / ASSESSMENT AND PLAN / ED COURSE  As part of my medical decision making, I reviewed the following data within the electronic MEDICAL RECORD NUMBER Nursing notes reviewed and incorporated and Notes from prior ED visits   27 year old male who presents with left otalgia secondary to otitis media.  Will start on azithromycin, lidocaine drops for pain relief and he will follow-up with his PCP next week as needed.  Strict return precautions given.  Patient verbalizes understanding and agrees with plan of care.      ____________________________________________   FINAL CLINICAL IMPRESSION(S) / ED DIAGNOSES  Final diagnoses:  Otalgia of left ear  Acute serous otitis media of left ear, recurrence not specified     ED Discharge Orders        Ordered    azithromycin (ZITHROMAX) 250 MG tablet  Daily     07/26/17 0121        Note:  This document was prepared using Dragon voice recognition software and may include unintentional dictation errors.    Irean Hong, MD 07/26/17 [phone]

## 2018-07-21 ENCOUNTER — Encounter: Payer: Self-pay | Admitting: Emergency Medicine

## 2018-07-21 ENCOUNTER — Emergency Department
Admission: EM | Admit: 2018-07-21 | Discharge: 2018-07-21 | Disposition: A | Discharge status: Home / Self Care | Payer: Self-pay | Attending: Emergency Medicine | Admitting: Emergency Medicine

## 2018-07-21 DIAGNOSIS — X58XXXA Exposure to other specified factors, initial encounter: Secondary | ICD-10-CM | POA: Insufficient documentation

## 2018-07-21 DIAGNOSIS — S39012A Strain of muscle, fascia and tendon of lower back, initial encounter: Secondary | ICD-10-CM | POA: Insufficient documentation

## 2018-07-21 DIAGNOSIS — Y929 Unspecified place or not applicable: Secondary | ICD-10-CM | POA: Insufficient documentation

## 2018-07-21 DIAGNOSIS — Y998 Other external cause status: Secondary | ICD-10-CM | POA: Insufficient documentation

## 2018-07-21 DIAGNOSIS — Y9389 Activity, other specified: Secondary | ICD-10-CM | POA: Insufficient documentation

## 2018-07-21 DIAGNOSIS — F17210 Nicotine dependence, cigarettes, uncomplicated: Secondary | ICD-10-CM | POA: Insufficient documentation

## 2018-07-21 MED ORDER — METHOCARBAMOL 500 MG PO TABS
1000.0000 mg | ORAL_TABLET | Freq: Once | ORAL | Status: AC
  Administered 2018-07-21: 1000 mg via ORAL
  Filled 2018-07-21: qty 2

## 2018-07-21 MED ORDER — NAPROXEN 500 MG PO TABS: 500.0000 mg | ORAL_TABLET | Freq: Two times a day (BID) | ORAL | 0 refills | Status: DC

## 2018-07-21 MED ORDER — METHOCARBAMOL 500 MG PO TABS: ORAL_TABLET | ORAL | 0 refills | Status: DC

## 2018-07-21 MED ORDER — HYDROCODONE-ACETAMINOPHEN 5-325 MG PO TABS
1.0000 | ORAL_TABLET | Freq: Once | ORAL | Status: AC
  Administered 2018-07-21: 1 via ORAL
  Filled 2018-07-21: qty 1

## 2018-07-21 MED ORDER — KETOROLAC TROMETHAMINE 30 MG/ML IJ SOLN
30.0000 mg | Freq: Once | INTRAMUSCULAR | Status: AC
  Administered 2018-07-21: 30 mg via INTRAMUSCULAR
  Filled 2018-07-21: qty 1

## 2018-07-21 NOTE — ED Notes (Signed)
See triage note  Presents with pain to mid back  States he developed pain with cough  Describes pain as muscle spasm like  Denies any fever

## 2018-07-21 NOTE — ED Provider Notes (Signed)
[facility] Emergency Department Provider Note   ____________________________________________   First MD Initiated Contact with Patient 07/21/18 1008     (approximate)  I have reviewed the triage vital signs and the nursing notes.   HISTORY  Chief Complaint Back Pain   HPI Tony Lowery is a 28 y.o. male   to the ED with complaint of right-sided low back pain that started this morning.  Patient states he felt a pop.  He states that initially he has been doing a lot of sneezing but it was while he was coughing that he felt a pop.  He denies any previous serious injuries to his back but has had some minor strains in the past.  He denies any urinary symptoms.  He has not taken any over-the-counter medication prior to arrival.  He denies any paresthesias, saddle anesthesias or incontinence of bowel or bladder.  Patient is ambulatory without assistance.   Past Medical History:  Diagnosis Date  . Irregular heartbeat     There are no active problems to display for this patient.   History reviewed. No pertinent surgical history.  Prior to Admission medications   Medication Sig Start Date End Date Taking? Authorizing Provider  methocarbamol (ROBAXIN) 500 MG tablet 1-2 every 6 hours prn muscle spasms 07/21/18   Bridget HartshornSummers, Roselynne Lortz L, PA-C  naproxen (NAPROSYN) 500 MG tablet Take 1 tablet (500 mg total) by mouth 2 (two) times daily with a meal. 07/21/18   Tommi RumpsSummers, Caliph Borowiak L, PA-C    Allergies Amoxicillin  History reviewed. No pertinent family history.  Social History Social History   Tobacco Use  . Smoking status: Current Every Day Smoker    Packs/day: 1.00    Types: Cigarettes  . Smokeless tobacco: Never Used  Substance Use Topics  . Alcohol use: No    Frequency: Never  . Drug use: Not on file    Review of Systems Constitutional: No fever/chills Cardiovascular: Denies chest pain. Respiratory: Denies shortness of breath. Gastrointestinal: No abdominal  pain.  No nausea, no vomiting.  Genitourinary: Negative for dysuria. Musculoskeletal: Positive back pain. Skin: Negative for rash. Neurological: Negative for headaches, focal weakness or numbness. ___________________________________________   PHYSICAL EXAM:  VITAL SIGNS: ED Triage Vitals  Enc Vitals Group     BP 07/21/18 0944 (!) 133/98     Pulse Rate 07/21/18 0944 73     Resp 07/21/18 0944 15     Temp 07/21/18 0944 98.4 F (36.9 C)     Temp Source 07/21/18 0944 Oral     SpO2 07/21/18 0944 99 %     Weight 07/21/18 0935 250 lb (113.4 kg)     Height 07/21/18 0935 5\' 6"  (1.676 m)     Head Circumference --      Peak Flow --      Pain Score 07/21/18 0935 0     Pain Loc --      Pain Edu? --      Excl. in GC? --    Constitutional: Alert and oriented. Well appearing and in no acute distress. Eyes: Conjunctivae are normal.  Head: Atraumatic. Neck: No stridor.   Cardiovascular: Normal rate, regular rhythm. Grossly normal heart sounds.  Good peripheral circulation. Respiratory: Normal respiratory effort.  No retractions. Lungs CTAB. Gastrointestinal: Soft and nontender. No distention.   Musculoskeletal: On examination of the back there is no gross deformity and no point tenderness on palpation of the lumbar spine.  No step-offs are appreciated.  Range of motion  is guarded and slow secondary to discomfort.  There is moderate tenderness on palpation of the right lateral and paravertebral muscles.  No skin discoloration or abrasions are seen.  Straight leg raises are negative.  Good muscle strength bilaterally. Neurologic:  Normal speech and language. No gross focal neurologic deficits are appreciated.  Skin:  Skin is warm, dry and intact. No rash noted. Psychiatric: Mood and affect are normal. Speech and behavior are normal.  ____________________________________________   LABS (all labs ordered are listed, but only abnormal results are displayed)  Labs Reviewed - No data to  display  PROCEDURES  Procedure(s) performed: None  Procedures  Critical Care performed: No  ____________________________________________   INITIAL IMPRESSION / ASSESSMENT AND PLAN / ED COURSE  As part of my medical decision making, I reviewed the following data within the electronic MEDICAL RECORD NUMBER Notes from prior ED visits and [place] Controlled Substance Database  Presents to the ED with complaint of right lower back pain that started this morning.  Patient states he initially was sneezing but when he coughed he felt "a pop".  Patient denies any surgeries or injuries to his back.  Neurologically he is intact.  Patient is ambulatory without any assistance.  He had not taken any over-the-counter medication prior to arrival.  Physical exam was positive for moderate tenderness on palpation of the right lumbar muscles lateral aspect.  He was given Toradol 30 mg IM, Robaxin 1000 mg p.o. and Norco 1 tablet p.o.  Patient was discharged with prescription for Robaxin and naproxen.  He was given a note to remain out of work the next 2 days.  He is encouraged to use ice or heat to his back as needed and to follow-up if any continued problems.  ____________________________________________   FINAL CLINICAL IMPRESSION(S) / ED DIAGNOSES  Final diagnoses:  Strain of lumbar region, initial encounter     ED Discharge Orders         Ordered    methocarbamol (ROBAXIN) 500 MG tablet     07/21/18 1102    naproxen (NAPROSYN) 500 MG tablet  2 times daily with meals     07/21/18 1102           Note:  This document was prepared using Dragon voice recognition software and may include unintentional dictation errors.    Tommi Rumps, PA-C 07/21/18 1141    Emily Filbert, MD 07/21/18 [phone]

## 2018-07-21 NOTE — Discharge Instructions (Signed)
Follow-up with 1 the clinics listed on your discharge papers to obtain a primary care provider.  You may also follow-up with [facility] acute care or return to the emergency department if any worsening of your symptoms.  Begin taking medication sent to your pharmacy as directed.  Robaxin 1 or 2 tablets every 6 hours as needed for muscle spasms.  Naproxen 500 mg twice daily with food.  You may use ice or heat to your back as needed for discomfort.  Do not drive or operate machinery while taking the muscle relaxant.

## 2018-07-21 NOTE — ED Triage Notes (Signed)
Pt reports last night he coughed and felt something pop in his right lower back and this am he was stiff. Pt denies urinary sx's, states back did not hurt until he coughed.

## 2020-06-16 ENCOUNTER — Other Ambulatory Visit: Payer: Self-pay

## 2020-06-16 ENCOUNTER — Encounter: Payer: Self-pay | Admitting: Emergency Medicine

## 2020-06-16 ENCOUNTER — Emergency Department
Admission: EM | Admit: 2020-06-16 | Discharge: 2020-06-16 | Disposition: A | Discharge status: Home / Self Care | Payer: Self-pay | Attending: Student in an Organized Health Care Education/Training Program | Admitting: Student in an Organized Health Care Education/Training Program

## 2020-06-16 ENCOUNTER — Emergency Department: Payer: Self-pay

## 2020-06-16 DIAGNOSIS — F17210 Nicotine dependence, cigarettes, uncomplicated: Secondary | ICD-10-CM | POA: Insufficient documentation

## 2020-06-16 DIAGNOSIS — R109 Unspecified abdominal pain: Secondary | ICD-10-CM

## 2020-06-16 DIAGNOSIS — N132 Hydronephrosis with renal and ureteral calculous obstruction: Secondary | ICD-10-CM | POA: Insufficient documentation

## 2020-06-16 DIAGNOSIS — N200 Calculus of kidney: Secondary | ICD-10-CM

## 2020-06-16 LAB — URINALYSIS, COMPLETE (UACMP) WITH MICROSCOPIC
Bacteria, UA: NONE SEEN
Bilirubin Urine: NEGATIVE
Glucose, UA: NEGATIVE mg/dL
Ketones, ur: NEGATIVE mg/dL
Leukocytes,Ua: NEGATIVE
Nitrite: NEGATIVE
Protein, ur: NEGATIVE mg/dL
Specific Gravity, Urine: 1.019 (ref 1.005–1.030)
Squamous Epithelial / LPF: NONE SEEN (ref 0–5)
pH: 6 (ref 5.0–8.0)

## 2020-06-16 LAB — BASIC METABOLIC PANEL
Anion gap: 11 (ref 5–15)
BUN: 16 mg/dL (ref 6–20)
CO2: 24 mmol/L (ref 22–32)
Calcium: 9.3 mg/dL (ref 8.9–10.3)
Chloride: 101 mmol/L (ref 98–111)
Creatinine, Ser: 0.95 mg/dL (ref 0.61–1.24)
GFR, Estimated: >60 mL/min (ref >60)
Glucose, Bld: 118 mg/dL — ABNORMAL HIGH (ref 70–99)
Potassium: 4 mmol/L (ref 3.5–5.1)
Sodium: 136 mmol/L (ref 135–145)

## 2020-06-16 LAB — CBC
HCT: 46.4 % (ref 39.0–52.0)
Hemoglobin: 15.6 g/dL (ref 13.0–17.0)
MCH: 29.7 pg (ref 26.0–34.0)
MCHC: 33.6 g/dL (ref 30.0–36.0)
MCV: 88.2 fL (ref 80.0–100.0)
Platelets: 309 10*3/uL (ref 150–400)
RBC: 5.26 MIL/uL (ref 4.22–5.81)
RDW: 12.7 % (ref 11.5–15.5)
WBC: 10.6 10*3/uL — ABNORMAL HIGH (ref 4.0–10.5)
nRBC: 0 % (ref 0.0–0.2)

## 2020-06-16 MED ORDER — TAMSULOSIN HCL 0.4 MG PO CAPS: 0.4000 mg | ORAL_CAPSULE | Freq: Every day | ORAL | 0 refills | Status: DC

## 2020-06-16 MED ORDER — ONDANSETRON HCL 4 MG PO TABS: 4.0000 mg | ORAL_TABLET | Freq: Every day | ORAL | 0 refills | Status: AC | PRN

## 2020-06-16 MED ORDER — OXYCODONE-ACETAMINOPHEN 5-325 MG PO TABS: 1.0000 | ORAL_TABLET | ORAL | 0 refills | Status: AC | PRN

## 2020-06-16 MED ORDER — KETOROLAC TROMETHAMINE 30 MG/ML IJ SOLN
30.0000 mg | Freq: Once | INTRAMUSCULAR | Status: AC
  Administered 2020-06-16: 30 mg via INTRAMUSCULAR
  Filled 2020-06-16: qty 1

## 2020-06-16 NOTE — ED Triage Notes (Signed)
FIRST NURSE NOTE:  Pt arrived with c/o back pain, on R flank radiating towards RLQ and groin area.

## 2020-06-16 NOTE — ED Provider Notes (Signed)
[facility] Emergency Department Provider Note    Event Date/Time   First MD Initiated Contact with Patient 06/16/20 0945     (approximate)  I have reviewed the triage vital signs and the nursing notes.   HISTORY  Chief Complaint Flank Pain    HPI Tony Lowery is a 29 y.o. male with no significant past medical history presents to the ER for   evaluation of acute right flank pain radiating into his right groin and testicle.  States it waxes and wanes.  Is intermittently severe in nature and then a few moments later will relax.  He denies any personal history of kidney stones but has family history stone.  Denies any dysuria or hematuria.  No fevers.   Past Medical History:  Diagnosis Date  . Irregular heartbeat    History reviewed. No pertinent family history. History reviewed. No pertinent surgical history. There are no problems to display for this patient.     Prior to Admission medications   Medication Sig Start Date End Date Taking? Authorizing Provider  ondansetron (ZOFRAN) 4 MG tablet Take 1 tablet (4 mg total) by mouth daily as needed. 06/16/20 06/16/21 Yes Willy Eddy, MD  oxyCODONE-acetaminophen (PERCOCET) 5-325 MG tablet Take 1 tablet by mouth every 4 (four) hours as needed for severe pain. 06/16/20 06/16/21 Yes Willy Eddy, MD  tamsulosin (FLOMAX) 0.4 MG CAPS capsule Take 1 capsule (0.4 mg total) by mouth daily after supper. 06/16/20  Yes Willy Eddy, MD  methocarbamol (ROBAXIN) 500 MG tablet 1-2 every 6 hours prn muscle spasms 07/21/18   Bridget Hartshorn L, PA-C  naproxen (NAPROSYN) 500 MG tablet Take 1 tablet (500 mg total) by mouth 2 (two) times daily with a meal. 07/21/18   Tommi Rumps, PA-C    Allergies Amoxicillin    Social History Social History   Tobacco Use  . Smoking status: Current Every Day Smoker    Packs/day: 1.00    Types: Cigarettes  . Smokeless tobacco: Never Used  Substance Use Topics  .  Alcohol use: No    Review of Systems Patient denies headaches, rhinorrhea, blurry vision, numbness, shortness of breath, chest pain, edema, cough, abdominal pain, nausea, vomiting, diarrhea, dysuria, fevers, rashes or hallucinations unless otherwise stated above in HPI. ____________________________________________   PHYSICAL EXAM:  VITAL SIGNS: Vitals:   06/16/20 0832  BP: (!) 159/93  Pulse: (!) 112  Resp: 18  Temp: 98.3 F (36.8 C)  SpO2: 99%    Constitutional: Alert and oriented.  Eyes: Conjunctivae are normal.  Head: Atraumatic. Nose: No congestion/rhinnorhea. Mouth/Throat: Mucous membranes are moist.   Neck: No stridor. Painless ROM.  Cardiovascular: Normal rate, regular rhythm. Grossly normal heart sounds.  Good peripheral circulation. Respiratory: Normal respiratory effort.  No retractions. Lungs CTAB. Gastrointestinal: Soft and nontender, no hernia noted. No distention. No abdominal bruits. + right CVA tenderness. Genitourinary:  Musculoskeletal: No lower extremity tenderness nor edema.  No joint effusions. Neurologic:  Normal speech and language. No gross focal neurologic deficits are appreciated. No facial droop Skin:  Skin is warm, dry and intact. No rash noted. Psychiatric: Mood and affect are normal. Speech and behavior are normal.  ____________________________________________   LABS (all labs ordered are listed, but only abnormal results are displayed)  Results for orders placed or performed during the hospital encounter of 06/16/20 (from the past 24 hour(s))  Urinalysis, Complete w Microscopic     Status: Abnormal   Collection Time: 06/16/20  8:46 AM  Result Value  Ref Range   Color, Urine YELLOW (A) YELLOW   APPearance HAZY (A) CLEAR   Specific Gravity, Urine 1.019 1.005 - 1.030   pH 6.0 5.0 - 8.0   Glucose, UA NEGATIVE NEGATIVE mg/dL   Hgb urine dipstick MODERATE (A) NEGATIVE   Bilirubin Urine NEGATIVE NEGATIVE   Ketones, ur NEGATIVE NEGATIVE mg/dL    Protein, ur NEGATIVE NEGATIVE mg/dL   Nitrite NEGATIVE NEGATIVE   Leukocytes,Ua NEGATIVE NEGATIVE   RBC / HPF 6-10 0 - 5 RBC/hpf   WBC, UA 0-5 0 - 5 WBC/hpf   Bacteria, UA NONE SEEN NONE SEEN   Squamous Epithelial / LPF NONE SEEN 0 - 5   Mucus PRESENT   CBC     Status: Abnormal   Collection Time: 06/16/20  8:46 AM  Result Value Ref Range   WBC 10.6 (H) 4.0 - 10.5 K/uL   RBC 5.26 4.22 - 5.81 MIL/uL   Hemoglobin 15.6 13.0 - 17.0 g/dL   HCT 26.2 03.5 - 59.7 %   MCV 88.2 80.0 - 100.0 fL   MCH 29.7 26.0 - 34.0 pg   MCHC 33.6 30.0 - 36.0 g/dL   RDW 41.6 38.4 - 53.6 %   Platelets 309 150 - 400 K/uL   nRBC 0.0 0.0 - 0.2 %  Basic metabolic panel     Status: Abnormal   Collection Time: 06/16/20  8:46 AM  Result Value Ref Range   Sodium 136 135 - 145 mmol/L   Potassium 4.0 3.5 - 5.1 mmol/L   Chloride 101 98 - 111 mmol/L   CO2 24 22 - 32 mmol/L   Glucose, Bld 118 (H) 70 - 99 mg/dL   BUN 16 6 - 20 mg/dL   Creatinine, Ser 4.68 0.61 - 1.24 mg/dL   Calcium 9.3 8.9 - 03.2 mg/dL   GFR, Estimated >12 >24 mL/min   Anion gap 11 5 - 15   ____________________________________________  ____________________________________________  RADIOLOGY  I personally reviewed all radiographic images ordered to evaluate for the above acute complaints and reviewed radiology reports and findings.  These findings were personally discussed with the patient.  Please see medical record for radiology report.  ____________________________________________   PROCEDURES  Procedure(s) performed:  Procedures    Critical Care performed: no ____________________________________________   INITIAL IMPRESSION / ASSESSMENT AND PLAN / ED COURSE  Pertinent labs & imaging results that were available during my care of the patient were reviewed by me and considered in my medical decision making (see chart for details).   DDX: stone, colitis, cystitis, appendicitis, hernia  Tony Lowery is a 29 y.o. who presents  to the ED with symptoms as described above.  Patient nontoxic-appearing afebrile.  Exam as above.  History concerning for stone.  CT imaging will be ordered.  Will give IM pain medication  Clinical Course as of 06/16/20 1055  Fri Jun 16, 2020  1030 On my review of the CT it appears the patient has a right-sided proximal ureteral stone.  Currently waiting on urinalysis. [PR]    Clinical Course User Index [PR] Willy Eddy, MD    The patient was evaluated in Emergency Department today for the symptoms described in the history of present illness. He/she was evaluated in the context of the global COVID-19 pandemic, which necessitated consideration that the patient might be at risk for infection with the SARS-CoV-2 virus that causes COVID-19. Institutional protocols and algorithms that pertain to the evaluation of patients at risk for COVID-19 are in a state of  rapid change based on information released by regulatory bodies including the CDC and federal and state organizations. These policies and algorithms were followed during the patient's care in the ED.  As part of my medical decision making, I reviewed the following data within the electronic MEDICAL RECORD NUMBER Nursing notes reviewed and incorporated, Labs reviewed, notes from prior ED visits and [place] Controlled Substance Database   ____________________________________________   FINAL CLINICAL IMPRESSION(S) / ED DIAGNOSES  Final diagnoses:  Right flank pain  Kidney stone      NEW MEDICATIONS STARTED DURING THIS VISIT:  New Prescriptions   ONDANSETRON (ZOFRAN) 4 MG TABLET    Take 1 tablet (4 mg total) by mouth daily as needed.   OXYCODONE-ACETAMINOPHEN (PERCOCET) 5-325 MG TABLET    Take 1 tablet by mouth every 4 (four) hours as needed for severe pain.   TAMSULOSIN (FLOMAX) 0.4 MG CAPS CAPSULE    Take 1 capsule (0.4 mg total) by mouth daily after supper.     Note:  This document was prepared using Dragon voice recognition software  and may include unintentional dictation errors.    Willy Eddy, MD 06/16/20 1055

## 2020-06-16 NOTE — ED Triage Notes (Signed)
Pt arrived via POV with reports of R flank pain since 11pm, pt states the pain worse and radiates down to R groin and R testicle. Denies any N/V. No personal hx of kidney stones, but + family hx.

## 2021-05-25 IMAGING — CT CT RENAL STONE PROTOCOL
2 of 4 series · 16 of 46 positions shown, 18 images · non-contrast
Comparison: None.

CLINICAL DATA: Right flank pain since 11 p.m. radiating to the
right groin and scrotum.

EXAM:
CT ABDOMEN AND PELVIS WITHOUT CONTRAST
TECHNIQUE: Multidetector CT imaging of the abdomen and pelvis was performed
following the standard protocol without IV contrast.

[Series 2: stone full standard · axial · 0.94mm/px · z∈[-953,-428]mm · 13 of 117 slices shown, 15 images]
[im 6/117  soft-tissue]
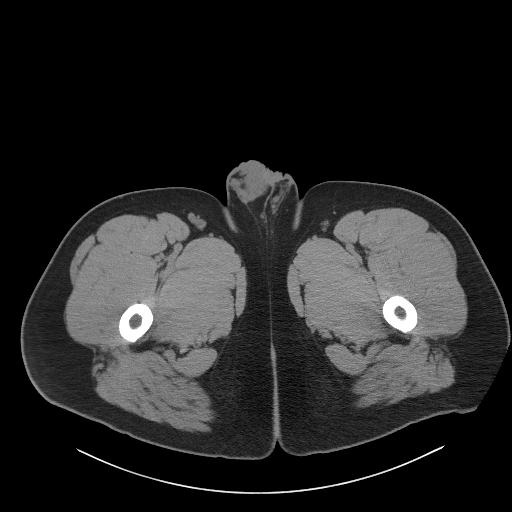
[im 6/117  bone]
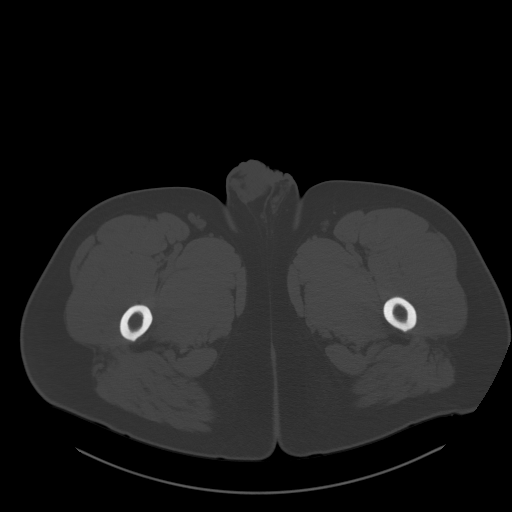
[im 16/117  soft-tissue]
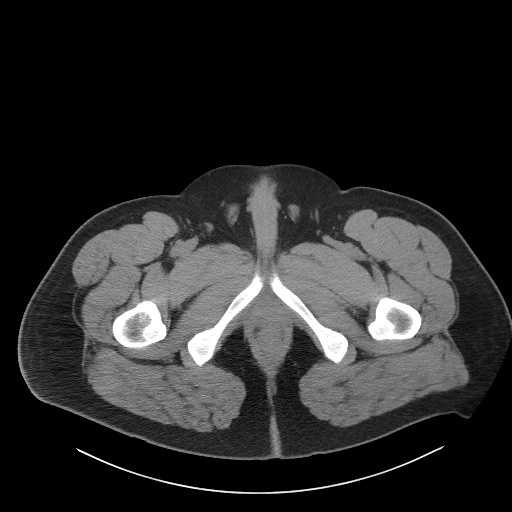
[im 26/117  soft-tissue]
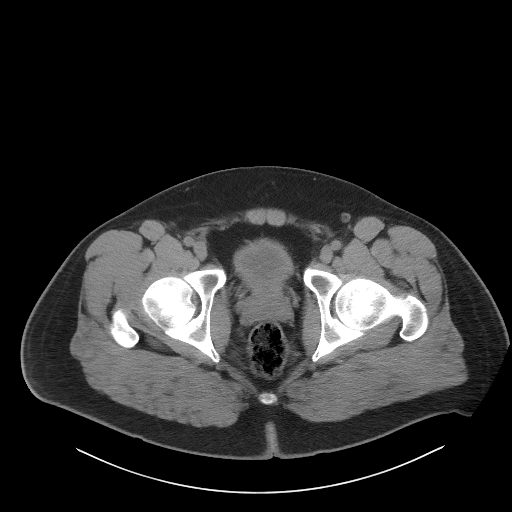
[im 31/117  soft-tissue]
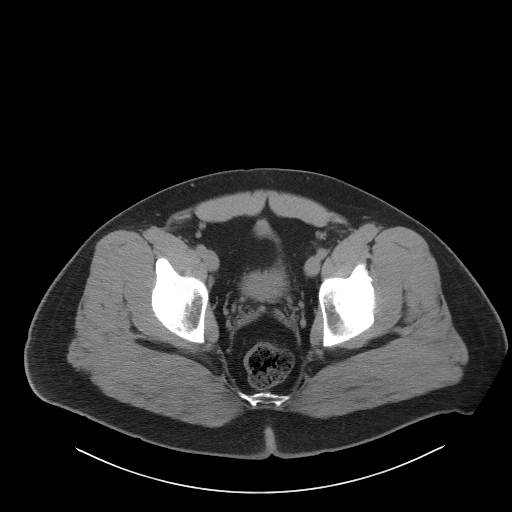
[im 41/117  soft-tissue]
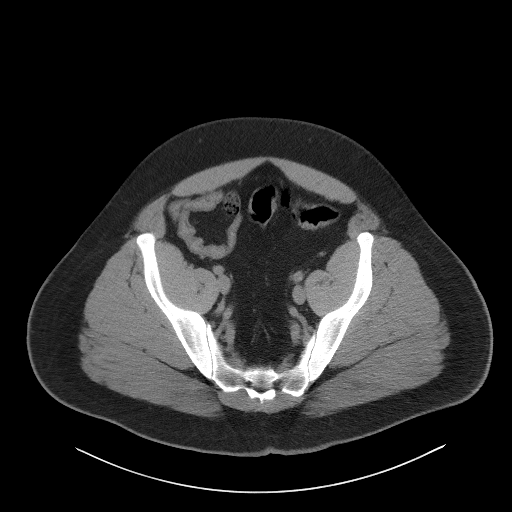
[im 51/117  soft-tissue]
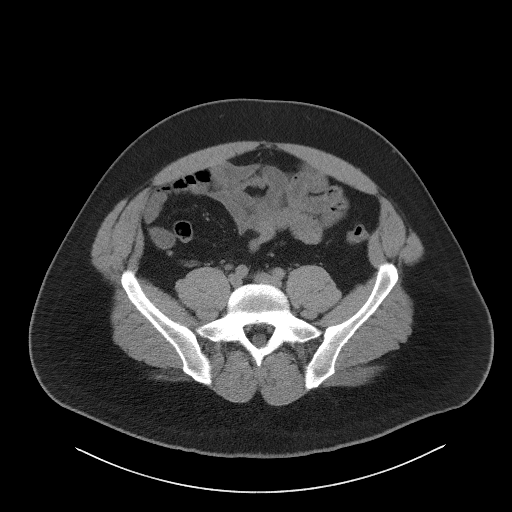
[im 61/117  soft-tissue]
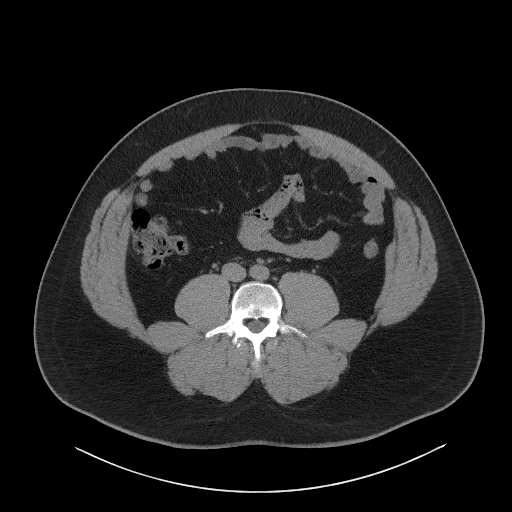
[im 66/117  soft-tissue]
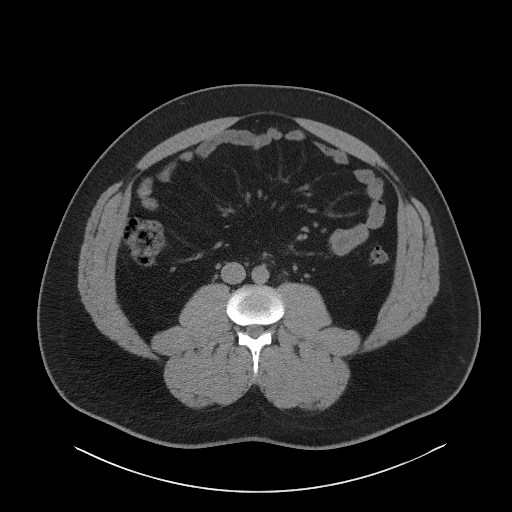
[im 76/117  soft-tissue]
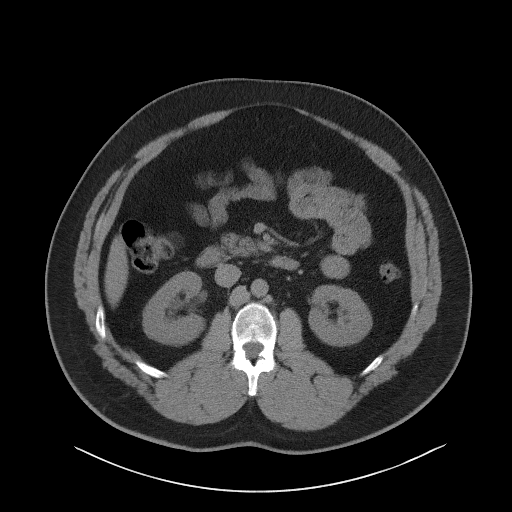
[im 76/117  bone]
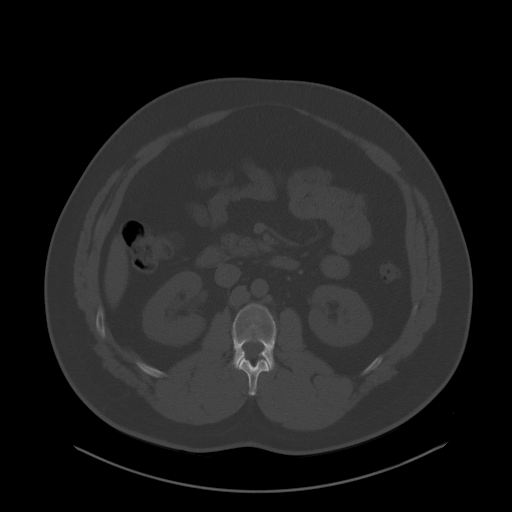
[im 86/117  soft-tissue]
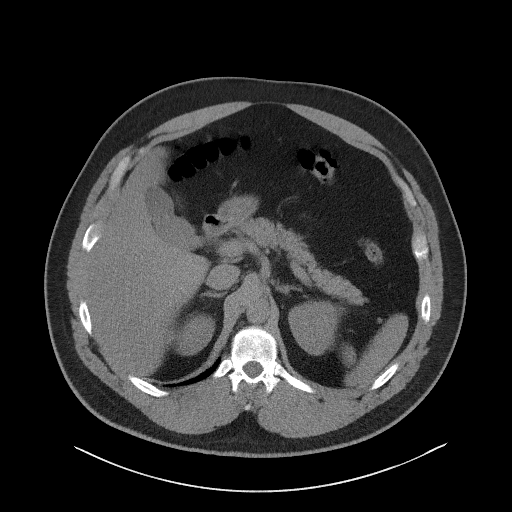
[im 91/117  soft-tissue]
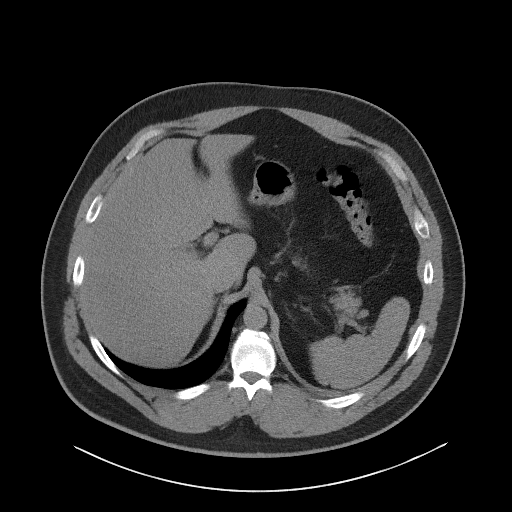
[im 101/117  soft-tissue]
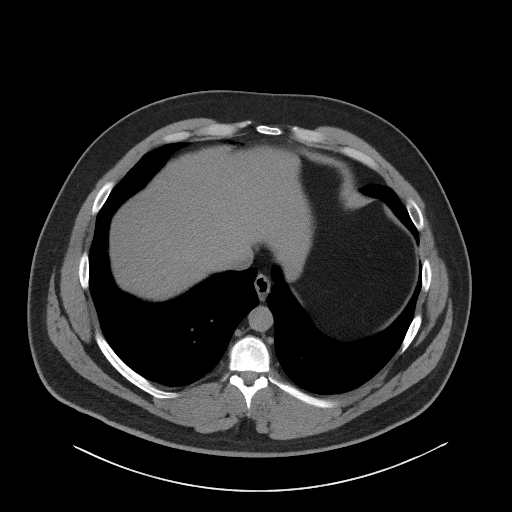
[im 111/117  soft-tissue]
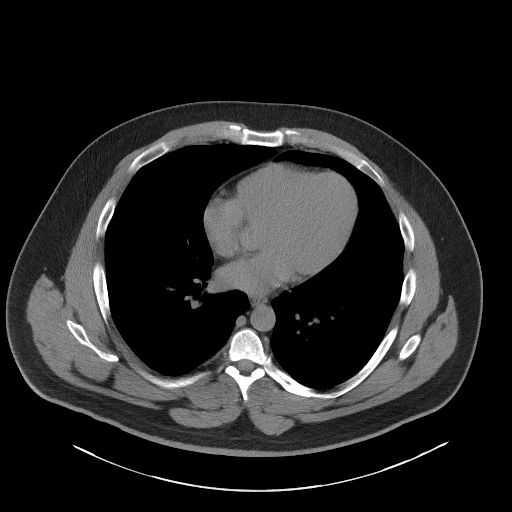

[Series 5: coronal · coronal · 0.92mm/px · 3 of 155 slices shown]
[im 52/155  soft-tissue]
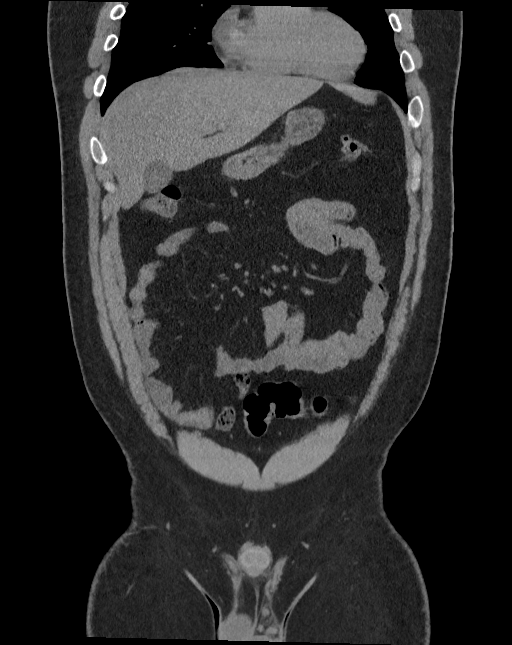
[im 69/155  soft-tissue]
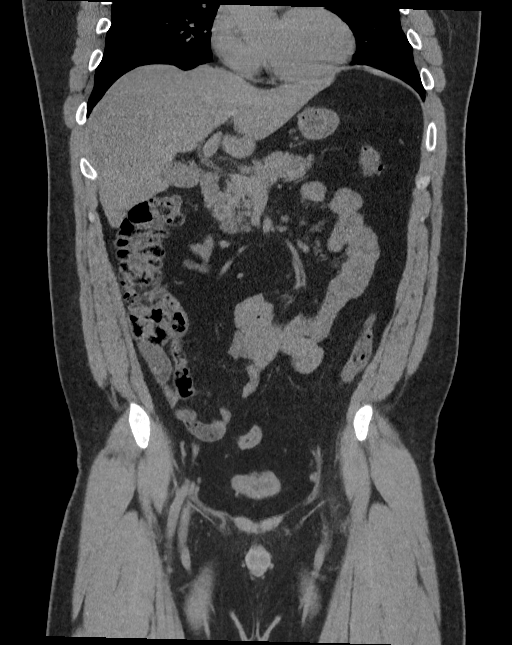
[im 86/155  soft-tissue]
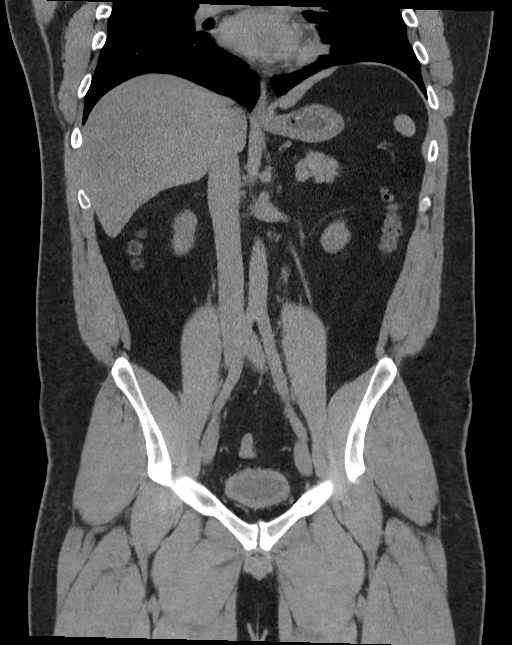

[16 of 46 positions shown; findings below may reference images not displayed]

FINDINGS: Lower chest: No significant pulmonary nodules or acute consolidative
airspace disease.

Hepatobiliary: Diffuse hepatic steatosis. Normal liver size. No
liver surface irregularity. No liver masses. Normal gallbladder with
no radiopaque cholelithiasis. No biliary ductal dilatation.

Pancreas: Normal, with no mass or duct dilation.

Spleen: Normal size. No mass.

Adrenals/Urinary Tract: Normal adrenals. Right lumbar ureter 2 mm
stone at the L3 level with minimal right hydroureteronephrosis. No
stones in the right renal collecting system. Two punctate clustered
nonobstructing 1 mm lower left renal stones. No left hydronephrosis.
No contour deforming renal masses. Normal caliber left ureter. No
additional ureteral stones. Normal bladder.

Stomach/Bowel: Normal non-distended stomach. Normal caliber small
bowel with no small bowel wall thickening. Normal appendix. Normal
large bowel with no diverticulosis, large bowel wall thickening or
pericolonic fat stranding.

Vascular/Lymphatic: Normal caliber abdominal aorta. No
pathologically enlarged lymph nodes in the abdomen or pelvis.

Reproductive: Normal size prostate.

Other: No pneumoperitoneum, ascites or focal fluid collection.

Musculoskeletal: No aggressive appearing focal osseous lesions. Mild
thoracic spondylosis.
IMPRESSION: 1. Right lumbar ureter 2 mm stone with minimal right
hydroureteronephrosis.
2. Punctate nonobstructing lower left renal stones.
3. Diffuse hepatic steatosis.

## 2021-06-19 ENCOUNTER — Ambulatory Visit: Payer: Self-pay

## 2021-06-19 NOTE — Telephone Encounter (Signed)
Patient called, answered and did not say anything, hung up the phone.  Summary: skin irritation   The patient was previously seen by the practice   The patient is currently experiencing skin irritation on their back, chest and sides   The patient shares that there are red bumps that feel similar to mosquito bites   The patient shares that the irritation began on 06/11/21   Please contact further if available   The patient has previously taken benadryl but it was ineffective

## 2021-06-19 NOTE — Telephone Encounter (Signed)
Patient called, he answered and hung up when I told him who I was. I called back, no answer, unable to leave VM due to mailbox not set up.   Summary: skin irritation   The patient was previously seen by the practice   The patient is currently experiencing skin irritation on their back, chest and sides   The patient shares that there are red bumps that feel similar to mosquito bites   The patient shares that the irritation began on 06/11/21   Please contact further if available   The patient has previously taken benadryl but it was ineffective

## 2021-06-25 ENCOUNTER — Telehealth: Payer: Self-pay | Admitting: Emergency Medicine

## 2021-06-25 DIAGNOSIS — L0291 Cutaneous abscess, unspecified: Secondary | ICD-10-CM

## 2021-06-25 MED ORDER — KETOCONAZOLE 2 % EX SHAM: 1.0000 "application " | MEDICATED_SHAMPOO | Freq: Every day | CUTANEOUS | 0 refills | Status: AC

## 2021-06-25 NOTE — Progress Notes (Signed)
Virtual Visit Consent   Tony Lowery, you are scheduled for a virtual visit with a [facility] provider today.     Just as with appointments in the office, your consent must be obtained to participate.  Your consent will be active for this visit and any virtual visit you may have with one of our providers in the next 365 days.     If you have a MyChart account, a copy of this consent can be sent to you electronically.  All virtual visits are billed to your insurance company just like a traditional visit in the office.    As this is a virtual visit, video technology does not allow for your provider to perform a traditional examination.  This may limit your provider's ability to fully assess your condition.  If your provider identifies any concerns that need to be evaluated in person or the need to arrange testing (such as labs, EKG, etc.), we will make arrangements to do so.     Although advances in technology are sophisticated, we cannot ensure that it will always work on either your end or our end.  If the connection with a video visit is poor, the visit may have to be switched to a telephone visit.  With either a video or telephone visit, we are not always able to ensure that we have a secure connection.     I need to obtain your verbal consent now.   Are you willing to proceed with your visit today?    Tony Lowery has provided verbal consent on 06/25/2021 for a virtual visit (video or telephone).   Tony Parsons, NP   Date: 06/25/2021 10:43 AM   Virtual Visit via Video Note   I, Tony Lowery, connected with  Tony Lowery  ([id number], 08/26/1990) on 06/25/21 at 10:30 AM EST by a video-enabled telemedicine application and verified that I am speaking with the correct person using two identifiers.  Location: Patient: Virtual Visit Location Patient: Home Provider: Virtual Visit Location Provider: Home Office   I discussed the limitations of evaluation and management by telemedicine and  the availability of in person appointments. The patient expressed understanding and agreed to proceed.    History of Present Illness: Tony Lowery is a 30 y.o. who identifies as a male who was assigned male at birth, and is being seen today for rash.  Rash began before Christmas last month.  Started on his back, spread up to his neck, across to his armpits, up to his face and beard, and there is some on his chest.  It is very itchy, it is not painful at all.  The itch is worse when he is hot or sweaty.  He has tried Benadryl for the itch but it just makes him sleepy, he does not know if it helps relieve the itch or not as he is sleeping.  He has tried hydrocortisone cream for no relief of his symptoms.  He has a history of recurrent cutaneous Candida.  He has seen a dermatologist for this in the past who prescribed ketoconazole shampoo applied to his body daily, leave on for 5 minutes then wash off.  Repeat daily for 7 days.  When symptoms first started, he felt like it was his cutaneous Candida recurring.  However, now the rash areas are raised bumps instead of the usual flat macules or patches.  He reports he scratches his rash constantly.  He is not traveled, he has not stayed in anyone else's  home.  He does not think he has bedbugs, scabies, or fleabites.  No one else in his home has similar symptoms.  Other than the raised bump aspect of the rash, he reports it feels similar to his cutaneous Candida in the past.  On a side note, patient reports he is taking 8 or 9 famotidine over-the-counter pills per day for acid reflux.  HPI: HPI  Problems: There are no problems to display for this patient.   Allergies:  Allergies  Allergen Reactions   Amoxicillin Rash   Medications:  Current Outpatient Medications:    ketoconazole (NIZORAL) 2 % shampoo, Apply 1 application topically daily for 7 days., Disp: 120 mL, Rfl: 0   methocarbamol (ROBAXIN) 500 MG tablet, 1-2 every 6 hours prn muscle spasms, Disp:  20 tablet, Rfl: 0   naproxen (NAPROSYN) 500 MG tablet, Take 1 tablet (500 mg total) by mouth 2 (two) times daily with a meal., Disp: 20 tablet, Rfl: 0   tamsulosin (FLOMAX) 0.4 MG CAPS capsule, Take 1 capsule (0.4 mg total) by mouth daily after supper., Disp: 5 capsule, Rfl: 0  Observations/Objective: Patient is well-developed, well-nourished in no acute distress.  Resting comfortably  at home.  Head is normocephalic, atraumatic.  No labored breathing.  Speech is clear and coherent with logical content.  Patient is alert and oriented at baseline.  On video, I can see on his chest and chin areas of erythema with red papules scattered.  Assessment and Plan: 1. Cutaneous abscess, unspecified site  Prescribed ketoconazole shampoo to use on his skin daily for 7 days as previously instructed by dermatologist.  Given the raised papule aspect of the rash, I am not confident this treatment plan will completely resolve his symptoms.  I advised him to try Claritin instead of Benadryl to see if that helps manage the itch.  I am wondering if he has a secondary bacterial infection on top of what may have started as a fungal infection and that may be causing the raised papules.  Patient to follow-up with his dermatologist or seek follow-up care if this current treatment plan does not resolve his symptoms.  I instructed him not to take an overdose of famotidine and to seek follow-up care for his acid reflux symptoms.  Follow Up Instructions: I discussed the assessment and treatment plan with the patient. The patient was provided an opportunity to ask questions and all were answered. The patient agreed with the plan and demonstrated an understanding of the instructions.  A copy of instructions were sent to the patient via MyChart unless otherwise noted below.   The patient was advised to call back or seek an in-person evaluation if the symptoms worsen or if the condition fails to improve as  anticipated.  Time:  I spent 13 minutes with the patient via telehealth technology discussing the above problems/concerns.    Tony Parsons, NP

## 2021-06-25 NOTE — Patient Instructions (Signed)
°  Edwinna Areola, thank you for joining Cathlyn Parsons, NP for today's virtual visit.  While this provider is not your primary care provider (PCP), if your PCP is located in our provider database this encounter information will be shared with them immediately following your visit.  Consent: (Patient) Edwinna Areola provided verbal consent for this virtual visit at the beginning of the encounter.  Current Medications:  Current Outpatient Medications:    ketoconazole (NIZORAL) 2 % shampoo, Apply 1 application topically daily for 7 days., Disp: 120 mL, Rfl: 0   methocarbamol (ROBAXIN) 500 MG tablet, 1-2 every 6 hours prn muscle spasms, Disp: 20 tablet, Rfl: 0   naproxen (NAPROSYN) 500 MG tablet, Take 1 tablet (500 mg total) by mouth 2 (two) times daily with a meal., Disp: 20 tablet, Rfl: 0   tamsulosin (FLOMAX) 0.4 MG CAPS capsule, Take 1 capsule (0.4 mg total) by mouth daily after supper., Disp: 5 capsule, Rfl: 0   Medications ordered in this encounter:  Meds ordered this encounter  Medications   ketoconazole (NIZORAL) 2 % shampoo    Sig: Apply 1 application topically daily for 7 days.    Dispense:  120 mL    Refill:  0     *If you need refills on other medications prior to your next appointment, please contact your pharmacy*  Follow-Up: Call back or seek an in-person evaluation if the symptoms worsen or if the condition fails to improve as anticipated.  Other Instructions Try Claritin (generic loratadine) as directed on the package to help with your itching since Benadryl makes you so sleepy. Use the shampoo on your body per your usual routine.  If this does not take care of your symptoms, you may need different treatment.  I suggest you try to get into your dermatologist soon as possible. Please stop taking famotidine more than what the package instructions say.  It is not safe to take an overdose of famotidine.  If you need help in the short-term with acid problems, consider trying Tums.   However, you do need to see your healthcare provider about your acid reflux to get help with it.   If you have been instructed to have an in-person evaluation today at a local Urgent Care facility, please use the link below. It will take you to a list of all of our available [facility], including address, phone number and hours of operation. Please do not delay care.  [facility]  If you or a family member do not have a primary care provider, use the link below to schedule a visit and establish care. When you choose a [facility] primary care physician or advanced practice provider, you gain a long-term partner in health. Find a Primary Care Provider  Learn more about [facility] in-office and virtual care options:  - Get Care Now

## 2021-07-02 ENCOUNTER — Ambulatory Visit: Payer: Self-pay | Admitting: Dermatology

## 2022-01-17 ENCOUNTER — Emergency Department
Admission: EM | Admit: 2022-01-17 | Discharge: 2022-01-17 | Disposition: A | Discharge status: Home / Self Care | Payer: Self-pay | Attending: Emergency Medicine | Admitting: Emergency Medicine

## 2022-01-17 ENCOUNTER — Emergency Department: Payer: Self-pay

## 2022-01-17 ENCOUNTER — Other Ambulatory Visit: Payer: Self-pay

## 2022-01-17 ENCOUNTER — Encounter: Payer: Self-pay | Admitting: Emergency Medicine

## 2022-01-17 DIAGNOSIS — W208XXA Other cause of strike by thrown, projected or falling object, initial encounter: Secondary | ICD-10-CM | POA: Insufficient documentation

## 2022-01-17 DIAGNOSIS — Y9389 Activity, other specified: Secondary | ICD-10-CM | POA: Insufficient documentation

## 2022-01-17 DIAGNOSIS — S93402A Sprain of unspecified ligament of left ankle, initial encounter: Secondary | ICD-10-CM | POA: Insufficient documentation

## 2022-01-17 DIAGNOSIS — M7989 Other specified soft tissue disorders: Secondary | ICD-10-CM | POA: Insufficient documentation

## 2022-01-17 DIAGNOSIS — S9002XA Contusion of left ankle, initial encounter: Secondary | ICD-10-CM

## 2022-01-17 DIAGNOSIS — Y92009 Unspecified place in unspecified non-institutional (private) residence as the place of occurrence of the external cause: Secondary | ICD-10-CM | POA: Insufficient documentation

## 2022-01-17 MED ORDER — CYCLOBENZAPRINE HCL 5 MG PO TABS: 5.0000 mg | ORAL_TABLET | Freq: Three times a day (TID) | ORAL | 0 refills | Status: DC | PRN

## 2022-01-17 MED ORDER — IBUPROFEN 800 MG PO TABS: 800.0000 mg | ORAL_TABLET | Freq: Three times a day (TID) | ORAL | 0 refills | Status: DC | PRN

## 2022-01-17 MED ORDER — CYCLOBENZAPRINE HCL 10 MG PO TABS
10.0000 mg | ORAL_TABLET | Freq: Once | ORAL | Status: AC
  Administered 2022-01-17: 10 mg via ORAL
  Filled 2022-01-17: qty 1
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
## 2022-01-17 NOTE — Discharge Instructions (Addendum)
Your exam and x-ray are normal at this time.  No evidence of any fracture or dislocation on the x-rays.  Wear the Ace bandage and/or stirrup splint as needed for support.  Use the crutches for weightbearing as tolerated.  Rest with the foot elevated and apply ice to reduce pain and swelling.  Follow-up with primary provider or podiatry for ongoing symptoms.

## 2022-01-17 NOTE — ED Triage Notes (Signed)
Pt fell off of a stool while working on his bike at home, striking heel. L ankle now worse and unable to ambulate, pain radiating up leg.  Denies PMH, takes nexium.  Has previous injured this ankle when 31 yo but never needed surgery

## 2022-01-17 NOTE — ED Triage Notes (Signed)
Sensation LLE intact, no bruising noted.

## 2022-05-04 ENCOUNTER — Telehealth: Payer: Self-pay | Admitting: Nurse Practitioner

## 2022-05-04 DIAGNOSIS — R42 Dizziness and giddiness: Secondary | ICD-10-CM

## 2022-05-04 MED ORDER — MECLIZINE HCL 25 MG PO TABS
25.0000 mg | ORAL_TABLET | Freq: Three times a day (TID) | ORAL | 0 refills | Status: DC | PRN
Start: 2022-05-04 — End: 2022-12-02

## 2022-05-04 NOTE — Progress Notes (Signed)
Virtual Visit Consent   Tony Lowery, you are scheduled for a virtual visit with Mary-Margaret Daphine Deutscher, FNP, a [facility] provider, today.     Just as with appointments in the office, your consent must be obtained to participate.  Your consent will be active for this visit and any virtual visit you may have with one of our providers in the next 365 days.     If you have a MyChart account, a copy of this consent can be sent to you electronically.  All virtual visits are billed to your insurance company just like a traditional visit in the office.    As this is a virtual visit, video technology does not allow for your provider to perform a traditional examination.  This may limit your provider's ability to fully assess your condition.  If your provider identifies any concerns that need to be evaluated in person or the need to arrange testing (such as labs, EKG, etc.), we will make arrangements to do so.     Although advances in technology are sophisticated, we cannot ensure that it will always work on either your end or our end.  If the connection with a video visit is poor, the visit may have to be switched to a telephone visit.  With either a video or telephone visit, we are not always able to ensure that we have a secure connection.     I need to obtain your verbal consent now.   Are you willing to proceed with your visit today? YES   [name] has provided verbal consent on 05/04/2022 for a virtual visit (video or telephone).   Mary-Margaret Daphine Deutscher, FNP   Date: 05/04/2022 3:57 PM   Virtual Visit via Video Note   I, Mary-Margaret Daphine Deutscher, connected with Akito Boomhower ([id number], Feb 20, 1991) on 05/04/22 at  4:00 PM EST by a video-enabled telemedicine application and verified that I am speaking with the correct person using two identifiers.  Location: Patient: Virtual Visit Location Patient: Home Provider: Virtual Visit Location Provider: Mobile   I discussed the limitations of  evaluation and management by telemedicine and the availability of in person appointments. The patient expressed understanding and agreed to proceed.    History of Present Illness: Tony Lowery is a 31 y.o. who identifies as a male who was assigned male at birth, and is being seen today for vertigo.  HPI: He has been dizzy since last Wednesday. It went away on Saturday then her developed covid. He has got some better.  Dizziness This is a new problem. The current episode started in the past 7 days. The problem occurs intermittently. The problem has been waxing and waning. Exacerbated by: moving quickly. He has tried nothing for the symptoms. The treatment provided mild relief.    Review of Systems  Eyes:  Negative for blurred vision, double vision and photophobia.  Neurological:  Positive for dizziness.    Problems: There are no problems to display for this patient.   Allergies:  Allergies  Allergen Reactions   Amoxicillin Rash   Medications:  Current Outpatient Medications:    cyclobenzaprine (FLEXERIL) 5 MG tablet, Take 1 tablet (5 mg total) by mouth 3 (three) times daily as needed., Disp: 15 tablet, Rfl: 0   ibuprofen (ADVIL) 800 MG tablet, Take 1 tablet (800 mg total) by mouth every 8 (eight) hours as needed., Disp: 30 tablet, Rfl: 0  Observations/Objective: Patient is well-developed, well-nourished in no acute distress.  Resting comfortably  at home.  Head  is normocephalic, atraumatic.  No labored breathing.  Speech is clear and coherent with logical content.  Patient is alert and oriented at baseline.    Assessment and Plan:  Raevon Broom in today with chief complaint of Dizziness   1. Vertigo Meclizine - sedation precautions Force fluids Rest  Meds ordered this encounter  Medications   meclizine (ANTIVERT) 25 MG tablet    Sig: Take 1 tablet (25 mg total) by mouth 3 (three) times daily as needed for dizziness.    Dispense:  30 tablet    Refill:  0    Order  Specific Question:   Supervising Provider    Answer:   Merrilee Jansky [id number]      Follow Up Instructions: I discussed the assessment and treatment plan with the patient. The patient was provided an opportunity to ask questions and all were answered. The patient agreed with the plan and demonstrated an understanding of the instructions.  A copy of instructions were sent to the patient via MyChart.  The patient was advised to call back or seek an in-person evaluation if the symptoms worsen or if the condition fails to improve as anticipated.  Time:  I spent 6 minutes with the patient via telehealth technology discussing the above problems/concerns.    Mary-Margaret Daphine Deutscher, FNP

## 2022-05-04 NOTE — Patient Instructions (Signed)
  Tony Lowery, thank you for joining Tony Pierini, FNP for today's virtual visit.  While this provider is not your primary care provider (PCP), if your PCP is located in our provider database this encounter information will be shared with them immediately following your visit.   A [facility] MyChart account gives you access to today's visit and all your visits, tests, and labs performed at [facility] " click here if you don't have a [facility] MyChart account or go to [email or web address]  Consent: (Patient) Tony Lowery provided verbal consent for this virtual visit at the beginning of the encounter.  Current Medications:  Current Outpatient Medications:    meclizine (ANTIVERT) 25 MG tablet, Take 1 tablet (25 mg total) by mouth 3 (three) times daily as needed for dizziness., Disp: 30 tablet, Rfl: 0   cyclobenzaprine (FLEXERIL) 5 MG tablet, Take 1 tablet (5 mg total) by mouth 3 (three) times daily as needed., Disp: 15 tablet, Rfl: 0   ibuprofen (ADVIL) 800 MG tablet, Take 1 tablet (800 mg total) by mouth every 8 (eight) hours as needed., Disp: 30 tablet, Rfl: 0   Medications ordered in this encounter:  Meds ordered this encounter  Medications   meclizine (ANTIVERT) 25 MG tablet    Sig: Take 1 tablet (25 mg total) by mouth 3 (three) times daily as needed for dizziness.    Dispense:  30 tablet    Refill:  0    Order Specific Question:   Supervising Provider    Answer:   Tony Lowery [id number]     *If you need refills on other medications prior to your next appointment, please contact your pharmacy*  Follow-Up: Call back or seek an in-person evaluation if the symptoms worsen or if the condition fails to improve as anticipated.  [facility] [phone]  Other Instructions Force fluids Rest Secation precautions with meclizine prescribed    If you have been instructed to have an in-person evaluation today at a local Urgent Care  facility, please use the link below. It will take you to a list of all of our available [facility], including address, phone number and hours of operation. Please do not delay care.  [facility]  If you or a family member do not have a primary care provider, use the link below to schedule a visit and establish care. When you choose a [facility] primary care physician or advanced practice provider, you gain a long-term partner in health. Find a Primary Care Provider  Learn more about [facility] in-office and virtual care options: [facility] - Get Care Now

## 2022-12-02 ENCOUNTER — Telehealth: Payer: 59 | Admitting: Physician Assistant

## 2022-12-02 DIAGNOSIS — J069 Acute upper respiratory infection, unspecified: Secondary | ICD-10-CM | POA: Diagnosis not present

## 2022-12-02 MED ORDER — AZITHROMYCIN 250 MG PO TABS: ORAL_TABLET | ORAL | 0 refills | Status: AC

## 2022-12-02 MED ORDER — BENZONATATE 100 MG PO CAPS: 100.0000 mg | ORAL_CAPSULE | Freq: Three times a day (TID) | ORAL | 0 refills | Status: DC | PRN

## 2022-12-02 MED ORDER — PREDNISONE 20 MG PO TABS: 40.0000 mg | ORAL_TABLET | Freq: Every day | ORAL | 0 refills | Status: DC

## 2022-12-02 NOTE — Patient Instructions (Signed)
Tony Lowery, thank you for joining [name], PA-C for today's virtual visit.  While this provider is not your primary care provider (PCP), if your PCP is located in our provider database this encounter information will be shared with them immediately following your visit.   A [place] MyChart account gives you access to today's visit and all your visits, tests, and labs performed at [facility] " click here if you don't have a [place] MyChart account or go to [email or web address]  Consent: (Patient) Tony Lowery provided verbal consent for this virtual visit at the beginning of the encounter.  Current Medications: No current outpatient medications on file.   Medications ordered in this encounter:  No orders of the defined types were placed in this encounter.    *If you need refills on other medications prior to your next appointment, please contact your pharmacy*  Follow-Up: Call back or seek an in-person evaluation if the symptoms worsen or if the condition fails to improve as anticipated.  [facility] [phone]  Other Instructions Increase fluids.  Get plenty of rest. Use Mucinex for congestion. Take the cough medication and prednisone as directed.. Take a daily probiotic (I recommend Align or Culturelle, but even Activia Yogurt may be beneficial).  A humidifier placed in the bedroom may offer some relief for a dry, scratchy throat of nasal irritation. If no substantial improvement over next 48 hours or anything continues to progress, you can start the antibiotic as directed. Read information below on acute bronchitis. Please call or return to clinic if symptoms are not improving.  Acute Bronchitis Bronchitis is when the airways that extend from the windpipe into the lungs get red, puffy, and painful (inflamed). Bronchitis often causes thick spit (mucus) to develop. This leads to a cough. A cough is the most common symptom of  bronchitis. In acute bronchitis, the condition usually begins suddenly and goes away over time (usually in 2 weeks). Smoking, allergies, and asthma can make bronchitis worse. Repeated episodes of bronchitis may cause more lung problems.  HOME CARE Rest. Drink enough fluids to keep your pee (urine) clear or pale yellow (unless you need to limit fluids as told by your doctor). Only take over-the-counter or prescription medicines as told by your doctor. Avoid smoking and secondhand smoke. These can make bronchitis worse. If you are a smoker, think about using nicotine gum or skin patches. Quitting smoking will help your lungs heal faster. Reduce the chance of getting bronchitis again by: Washing your hands often. Avoiding people with cold symptoms. Trying not to touch your hands to your mouth, nose, or eyes. Follow up with your doctor as told.  GET HELP IF: Your symptoms do not improve after 1 week of treatment. Symptoms include: Cough. Fever. Coughing up thick spit. Body aches. Chest congestion. Chills. Shortness of breath. Sore throat.  GET HELP RIGHT AWAY IF:  You have an increased fever. You have chills. You have severe shortness of breath. You have bloody thick spit (sputum). You throw up (vomit) often. You lose too much body fluid (dehydration). You have a severe headache. You faint.  MAKE SURE YOU:  Understand these instructions. Will watch your condition. Will get help right away if you are not doing well or get worse. Document Released: 11/20/2007 Document Revised: 02/03/2013 Document Reviewed: 11/24/2012 [facility] Patient Information 2015 [place], [place]. This information is not intended to replace advice given to you by your health care provider. Make sure you discuss any questions you  have with your health care provider.    If you have been instructed to have an in-person evaluation today at a local Urgent Care facility, please use the link below. It will take you  to a list of all of our available [facility], including address, phone number and hours of operation. Please do not delay care.  [facility]  If you or a family member do not have a primary care provider, use the link below to schedule a visit and establish care. When you choose a [facility] primary care physician or advanced practice provider, you gain a long-term partner in health. Find a Primary Care Provider  Learn more about [facility]'s in-office and virtual care options: [place] - Get Care Now

## 2022-12-02 NOTE — Progress Notes (Signed)
Virtual Visit Consent   [name], you are scheduled for a virtual visit with a [facility] provider today. Just as with appointments in the office, your consent must be obtained to participate. Your consent will be active for this visit and any virtual visit you may have with one of our providers in the next 365 days. If you have a MyChart account, a copy of this consent can be sent to you electronically.  As this is a virtual visit, video technology does not allow for your provider to perform a traditional examination. This may limit your provider's ability to fully assess your condition. If your provider identifies any concerns that need to be evaluated in person or the need to arrange testing (such as labs, EKG, etc.), we will make arrangements to do so. Although advances in technology are sophisticated, we cannot ensure that it will always work on either your end or our end. If the connection with a video visit is poor, the visit may have to be switched to a telephone visit. With either a video or telephone visit, we are not always able to ensure that we have a secure connection.  By engaging in this virtual visit, you consent to the provision of healthcare and authorize for your insurance to be billed (if applicable) for the services provided during this visit. Depending on your insurance coverage, you may receive a charge related to this service.  I need to obtain your verbal consent now. Are you willing to proceed with your visit today? Tony Lowery has provided verbal consent on 12/02/2022 for a virtual visit (video or telephone). [name], [place]  Date: 12/02/2022 6:55 PM  Virtual Visit via Video Note   I, [name], connected with  [name]  ([id number], Aug 09, 1990) on 12/02/22 at  7:00 PM EDT by a video-enabled telemedicine application and verified that I am speaking with the correct person using two identifiers.  Location: Patient: Virtual Visit Location  Patient: Home Provider: Virtual Visit Location Provider: Home Office   I discussed the limitations of evaluation and management by telemedicine and the availability of in person appointments. The patient expressed understanding and agreed to proceed.    History of Present Illness: Tony Lowery is a 32 y.o. who identifies as a male who was assigned male at birth, and is being seen today for URI symptoms starting over the past 6 days. Notes children who have been sick as well but are better now. His symptoms have continued to progress. Notes chest congestion and cough that is productive of thick green phlegm now with substantial throat pain. Notes back of throat is mildly swollen.  HPI: HPI  Problems: There are no problems to display for this patient.   Allergies:  Allergies  Allergen Reactions   Amoxicillin Rash   Medications: No current outpatient medications on file.  Observations/Objective: Patient is well-developed, well-nourished in no acute distress.  Resting comfortably at home.  Head is normocephalic, atraumatic.  No labored breathing. Speech is clear and coherent with logical content.  Patient is alert and oriented at baseline.   Assessment and Plan: 1. Upper respiratory tract infection, unspecified type  Giving multiple sick contacts at home, suspect viral in nature. Especially giving all kids were tested negative for strep. Patient's COVID testing negative. Supportive measures and OTC medications reviewed. Giving substantial throat pain, will add on short course of prednisone. Tessalon per orders. Delayed antibiotic use discussed. Script placed on hold at pharmacy.   Follow Up Instructions:  I discussed the assessment and treatment plan with the patient. The patient was provided an opportunity to ask questions and all were answered. The patient agreed with the plan and demonstrated an understanding of the instructions.  A copy of instructions were sent to the patient via  MyChart unless otherwise noted below.   The patient was advised to call back or seek an in-person evaluation if the symptoms worsen or if the condition fails to improve as anticipated.  Time:  I spent 10 minutes with the patient via telehealth technology discussing the above problems/concerns.    [name], PA-C

## 2023-01-23 ENCOUNTER — Telehealth: Payer: 59 | Admitting: Physician Assistant

## 2023-01-23 DIAGNOSIS — N368 Other specified disorders of urethra: Secondary | ICD-10-CM

## 2023-01-23 NOTE — Patient Instructions (Signed)
  Edwinna Areola, thank you for joining [name], PA-C for today's virtual visit.  While this provider is not your primary care provider (PCP), if your PCP is located in our provider database this encounter information will be shared with them immediately following your visit.   A [place] MyChart account gives you access to today's visit and all your visits, tests, and labs performed at [facility] " click here if you don't have a [place] MyChart account or go to [email or web address]  Consent: (Patient) Tony Lowery provided verbal consent for this virtual visit at the beginning of the encounter.  Current Medications:  Current Outpatient Medications:    benzonatate (TESSALON) 100 MG capsule, Take 1 capsule (100 mg total) by mouth 3 (three) times daily as needed for cough., Disp: 30 capsule, Rfl: 0   predniSONE (DELTASONE) 20 MG tablet, Take 2 tablets (40 mg total) by mouth daily with breakfast., Disp: 10 tablet, Rfl: 0   Medications ordered in this encounter:  No orders of the defined types were placed in this encounter.    *If you need refills on other medications prior to your next appointment, please contact your pharmacy*  Follow-Up: Call back or seek an in-person evaluation if the symptoms worsen or if the condition fails to improve as anticipated.  [facility] [phone]  Other Instructions Please avoid any sexual activity until this is resolved. Keep hydrated to flush out the urinary tract. Avoid caffeine and alcohol. You can continue the AZO for the next 24 hours.  If this is not resolving or any new/worsening symptoms, you need an in-person evaluation ASAP.   If you have been instructed to have an in-person evaluation today at a local Urgent Care facility, please use the link below. It will take you to a list of all of our available [facility], including address, phone number and hours of operation. Please do  not delay care.  [facility]  If you or a family member do not have a primary care provider, use the link below to schedule a visit and establish care. When you choose a [facility] primary care physician or advanced practice provider, you gain a long-term partner in health. Find a Primary Care Provider  Learn more about [facility]'s in-office and virtual care options: [place] - Get Care Now

## 2023-01-23 NOTE — Progress Notes (Signed)
Virtual Visit Consent   Tony Lowery, you are scheduled for a virtual visit with a [facility] provider today. Just as with appointments in the office, your consent must be obtained to participate. Your consent will be active for this visit and any virtual visit you may have with one of our providers in the next 365 days. If you have a MyChart account, a copy of this consent can be sent to you electronically.  As this is a virtual visit, video technology does not allow for your provider to perform a traditional examination. This may limit your provider's ability to fully assess your condition. If your provider identifies any concerns that need to be evaluated in person or the need to arrange testing (such as labs, EKG, etc.), we will make arrangements to do so. Although advances in technology are sophisticated, we cannot ensure that it will always work on either your end or our end. If the connection with a video visit is poor, the visit may have to be switched to a telephone visit. With either a video or telephone visit, we are not always able to ensure that we have a secure connection.  By engaging in this virtual visit, you consent to the provision of healthcare and authorize for your insurance to be billed (if applicable) for the services provided during this visit. Depending on your insurance coverage, you may receive a charge related to this service.  I need to obtain your verbal consent now. Are you willing to proceed with your visit today? Tony Lowery has provided verbal consent on 01/23/2023 for a virtual visit (video or telephone). [name], [place]  Date: 01/23/2023 2:01 PM  Virtual Visit via Video Note   I, [name], connected with  Tony Lowery  ([id number], 02/13/1991) on 01/23/23 at  2:00 PM EDT by a video-enabled telemedicine application and verified that I am speaking with the correct person using two identifiers.  Location: Patient: Virtual Visit Location  Patient: Home Provider: Virtual Visit Location Provider: Home Office   I discussed the limitations of evaluation and management by telemedicine and the availability of in person appointments. The patient expressed understanding and agreed to proceed.    History of Present Illness: Tony Lowery is a 32 y.o. who identifies as a male who was assigned male at birth, and is being seen today for irritation at the tip of his penis with a sense of urinary urgency, starting yesterday after being intimate with his wife and showering after. Notes today no pain or change to urinary habits but mild sense of urge to pee, felt only in the tip of the penis. Denies fever, chills, malaise. Denies penile swelling, lesion or discharge. Denies hematuria or cloudy urine.   Took some AZO which has helped. Denies concern for STI. Does note they used a new lubricant.   HPI: HPI  Problems: There are no problems to display for this patient.   Allergies:  Allergies  Allergen Reactions   Amoxicillin Rash   Medications:  Current Outpatient Medications:    benzonatate (TESSALON) 100 MG capsule, Take 1 capsule (100 mg total) by mouth 3 (three) times daily as needed for cough., Disp: 30 capsule, Rfl: 0   predniSONE (DELTASONE) 20 MG tablet, Take 2 tablets (40 mg total) by mouth daily with breakfast., Disp: 10 tablet, Rfl: 0  Observations/Objective: Patient is well-developed, well-nourished in no acute distress.  Resting comfortably  at home.  Head is normocephalic, atraumatic.  No labored breathing.  Speech is  clear and coherent with logical content. Patient is alert and oriented at baseline.   Assessment and Plan: 1. Urethral irritation  Likely due to combination of friction from intercourse and possible irritation from new lubricant or soap while showering after intercourse. Want him to avoid activity until this calms down. Increase fluids, avoiding caffeine. Ok to continue AZO for next 24 hours. If not resolving  or anything new/worsening he will need in person evaluation for examination and UA/culture.   Follow Up Instructions: I discussed the assessment and treatment plan with the patient. The patient was provided an opportunity to ask questions and all were answered. The patient agreed with the plan and demonstrated an understanding of the instructions.  A copy of instructions were sent to the patient via MyChart unless otherwise noted below.   The patient was advised to call back or seek an in-person evaluation if the symptoms worsen or if the condition fails to improve as anticipated.  Time:  I spent 10 minutes with the patient via telehealth technology discussing the above problems/concerns.    [name], PA-C

## 2023-03-28 ENCOUNTER — Telehealth: Payer: 59 | Admitting: Family Medicine

## 2023-03-28 DIAGNOSIS — S29011A Strain of muscle and tendon of front wall of thorax, initial encounter: Secondary | ICD-10-CM

## 2023-03-28 MED ORDER — NAPROXEN 500 MG PO TABS: 500.0000 mg | ORAL_TABLET | Freq: Two times a day (BID) | ORAL | 0 refills | Status: DC

## 2023-03-28 MED ORDER — CYCLOBENZAPRINE HCL 10 MG PO TABS: 10.0000 mg | ORAL_TABLET | Freq: Three times a day (TID) | ORAL | 0 refills | Status: DC | PRN

## 2023-03-28 NOTE — Patient Instructions (Signed)
Muscle Strain A muscle strain is an injury that occurs when a muscle is stretched beyond its normal length. Usually, a small number of muscle fibers are torn when this happens. There are three types of muscle strains. First-degree strains have the least amount of muscle fiber tearing and the least amount of pain. Second-degree and third-degree strains have more tearing and pain. Usually, recovery from muscle strain takes 1-2 weeks. Complete healing normally takes 5-6 weeks. What are the causes? This condition is caused when a sudden, violent force is placed on a muscle and stretches it too far. This may occur with a fall, while lifting, or during sports. What increases the risk? This condition is more likely to develop in athletes and people who are physically active. What are the signs or symptoms? Symptoms of this condition include: Pain. Tenderness. Bruising. Swelling. Trouble using the muscle. How is this diagnosed? This condition is diagnosed based on a physical exam and your medical history. Tests may also be done, including an X-ray, ultrasound, or MRI. How is this treated? This condition is initially treated with PRICE therapy. This therapy involves: Protecting the muscle from being injured again. Resting the injured muscle. Icing the injured muscle. Applying pressure (compression) to the injured muscle. This may be done with a splint or elastic bandage. Raising (elevating) the injured muscle. Your health care provider may also recommend medicine for pain. Follow these instructions at home: If you have a removable splint: Wear the splint as told by your health care provider. Remove it only as told by your health care provider. Check the skin around the splint every day. Tell your health care provider about any concerns. Loosen the splint if your fingers or toes tingle, become numb, or turn cold and blue. Keep the splint clean. If the splint is not waterproof: Do not let it get  wet. Cover it with a watertight covering when you take a bath or a shower. Managing pain, stiffness, and swelling  If directed, put ice on the injured area. To do this: If you have a removable splint, remove it as told by your health care provider. Put ice in a plastic bag. Place a towel between your skin and the bag. Leave the ice on for 20 minutes, 2-3 times a day. Remove the ice if your skin turns bright red. This is very important. If you cannot feel pain, heat, or cold, you have a greater risk of damage to the area. Move your fingers or toes often to reduce stiffness and swelling. Raise (elevate) the injured area above the level of your heart while you are sitting or lying down. Wear an elastic bandage as told by your health care provider. Make sure that it is not too tight. General instructions Take over-the-counter and prescription medicines only as told by your health care provider. Treatment may include muscle relaxants or medicines for pain and inflammation that are taken by mouth or applied to the skin. Restrict your activity and rest the injured muscle as told by your health care provider. Gentle movements may be allowed. If physical therapy was prescribed, do exercises as told by your health care provider. Do not put pressure on any part of the splint until it is fully hardened. This may take several hours. Do not use any products that contain nicotine or tobacco. These products include cigarettes, chewing tobacco, and vaping devices, such as e-cigarettes. If you need help quitting, ask your health care provider. Ask your health care provider when it is  safe to drive if you have a splint. Keep all follow-up visits. This is important. How is this prevented? Warm up before exercising. This helps to prevent future muscle strains. Contact a health care provider if: You have more pain or swelling in the injured area. Get help right away if: You have numbness or tingling in the  injured area. You lose a lot of strength in the injured area. Summary A muscle strain is an injury that occurs when a muscle is stretched beyond its normal length. This condition is caused when a sudden, violent force is placed on a muscle and stretches it too far. This condition is initially treated with PRICE therapy, which involves protecting, resting, icing, compressing, and elevating. Gentle movements may be allowed. If physical therapy was prescribed, do exercises as told by your health care provider. This information is not intended to replace advice given to you by your health care provider. Make sure you discuss any questions you have with your health care provider. Document Revised: 10/07/2022 Document Reviewed: 08/21/2020 Elsevier Patient Education  2024 [employer] Nonspecific Chest Pain, Adult Chest pain is an uncomfortable, tight, or painful feeling in the chest. The pain can feel like a crushing, aching, or squeezing pressure. A person can feel a burning or tingling sensation. Chest pain can also be felt in your back, neck, jaw, shoulder, or arm. This pain can be worse when you move, sneeze, or take a deep breath. Chest pain can be caused by a condition that is life-threatening. This must be treated right away. It can also be caused by something that is not life-threatening. If you have chest pain, it can be hard to know the difference, so it is important to get help right away to make sure that you do not have a serious condition. Some life-threatening causes of chest pain include: Heart attack. A tear in the body's main blood vessel (aortic dissection). Inflammation around your heart (pericarditis). A problem in the lungs, such as a blood clot (pulmonary embolism) or a collapsed lung (pneumothorax). Some non life-threatening causes of chest pain include: Heartburn. Anxiety or stress. Damage to the bones, muscles, and cartilage that make up your chest wall. Pneumonia or  bronchitis. Shingles infection (varicella-zoster virus). Your chest pain may come and go. It may also be constant. Your health care provider will do tests and other studies to find the cause of your pain. Treatment will depend on the cause of your chest pain. Follow these instructions at home: Medicines Take over-the-counter and prescription medicines only as told by your health care provider. If you were prescribed an antibiotic medicine, take it as told by your health care provider. Do not stop taking the antibiotic even if you start to feel better. Activity Avoid any activities that cause chest pain. Do not lift anything that is heavier than 10 lb (4.5 kg), or the limit that you are told, until your health care provider says that it is safe. Rest as directed by your health care provider. Return to your normal activities only as told by your health care provider. Ask your health care provider what activities are safe for you. Lifestyle     Do not use any products that contain nicotine or tobacco, such as cigarettes, e-cigarettes, and chewing tobacco. If you need help quitting, ask your health care provider. Do not drink alcohol. Make healthy lifestyle changes as recommended. These may include: Getting regular exercise. Ask your health care provider to suggest some exercises that are safe  for you. Eating a heart-healthy diet. This includes plenty of fresh fruits and vegetables, whole grains, low-fat (lean) protein, and low-fat dairy products. A dietitian can help you find healthy eating options. Maintaining a healthy weight. Managing any other health conditions you may have, such as high blood pressure (hypertension) or diabetes. Reducing stress, such as with yoga or relaxation techniques. General instructions Pay attention to any changes in your symptoms. It is up to you to get the results of any tests that were done. Ask your health care provider, or the department that is doing the  tests, when your results will be ready. Keep all follow-up visits as told by your health care provider. This is important. You may be asked to go for further testing if your chest pain does not go away. Contact a health care provider if: Your chest pain does not go away. You feel depressed. You have a fever. You notice changes in your symptoms or develop new symptoms. Get help right away if: Your chest pain gets worse. You have a cough that gets worse, or you cough up blood. You have severe pain in your abdomen. You faint. You have sudden, unexplained chest discomfort. You have sudden, unexplained discomfort in your arms, back, neck, or jaw. You have shortness of breath at any time. You suddenly start to sweat, or your skin gets clammy. You feel nausea or you vomit. You suddenly feel lightheaded or dizzy. You have severe weakness, or unexplained weakness or fatigue. Your heart begins to beat quickly, or it feels like it is skipping beats. These symptoms may represent a serious problem that is an emergency. Do not wait to see if the symptoms will go away. Get medical help right away. Call your local emergency services (911 in the [place] Do not drive yourself to the hospital. Summary Chest pain can be caused by a condition that is serious and requires urgent treatment. It may also be caused by something that is not life-threatening. Your health care provider may do lab tests and other studies to find the cause of your pain. Follow your health care provider's instructions on taking medicines, making lifestyle changes, and getting emergency treatment if symptoms become worse. Keep all follow-up visits as told by your health care provider. This includes visits for any further testing if your chest pain does not go away. This information is not intended to replace advice given to you by your health care provider. Make sure you discuss any questions you have with your health care  provider. Document Revised: 04/18/2022 Document Reviewed: 04/18/2022 Elsevier Patient Education  2024 [employer].

## 2023-03-28 NOTE — Progress Notes (Signed)
Virtual Visit Consent   Tony Lowery, you are scheduled for a virtual visit with a [facility] provider today. Just as with appointments in the office, your consent must be obtained to participate. Your consent will be active for this visit and any virtual visit you may have with one of our providers in the next 365 days. If you have a MyChart account, a copy of this consent can be sent to you electronically.  As this is a virtual visit, video technology does not allow for your provider to perform a traditional examination. This may limit your provider's ability to fully assess your condition. If your provider identifies any concerns that need to be evaluated in person or the need to arrange testing (such as labs, EKG, etc.), we will make arrangements to do so. Although advances in technology are sophisticated, we cannot ensure that it will always work on either your end or our end. If the connection with a video visit is poor, the visit may have to be switched to a telephone visit. With either a video or telephone visit, we are not always able to ensure that we have a secure connection.  By engaging in this virtual visit, you consent to the provision of healthcare and authorize for your insurance to be billed (if applicable) for the services provided during this visit. Depending on your insurance coverage, you may receive a charge related to this service.  I need to obtain your verbal consent now. Are you willing to proceed with your visit today? Tony Lowery has provided verbal consent on 03/28/2023 for a virtual visit (video or telephone). Georgana Curio, FNP  Date: 03/28/2023 10:21 AM  Virtual Visit via Video Note   I, Georgana Curio, connected with  Tony Lowery  ([id number], Nov 08, 1990) on 03/28/23 at 10:15 AM EDT by a video-enabled telemedicine application and verified that I am speaking with the correct person using two identifiers.  Location: Patient: Virtual Visit Location Patient:  Home Provider: Virtual Visit Location Provider: Home Office   I discussed the limitations of evaluation and management by telemedicine and the availability of in person appointments. The patient expressed understanding and agreed to proceed.    History of Present Illness: Tony Lowery is a 32 y.o. 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 Left chest and left shoulder pain since then. He had to leave work today. No cardiac symptoms. No cough fever or chills. Marland Kitchen  HPI: HPI  Problems: There are no problems to display for this patient.   Allergies:  Allergies  Allergen Reactions   Amoxicillin Rash   Medications:  Current Outpatient Medications:    benzonatate (TESSALON) 100 MG capsule, Take 1 capsule (100 mg total) by mouth 3 (three) times daily as needed for cough., Disp: 30 capsule, Rfl: 0   predniSONE (DELTASONE) 20 MG tablet, Take 2 tablets (40 mg total) by mouth daily with breakfast., Disp: 10 tablet, Rfl: 0  Observations/Objective: Patient is well-developed, well-nourished in no acute distress.  Resting comfortably  at home.  Head is normocephalic, atraumatic.  No labored breathing.  Speech is clear and coherent with logical content.  Patient is alert and oriented at baseline.    Assessment and Plan: 1. Muscle strain of chest wall, initial encounter  Heat, med use and side effects discussed, UC if sx worsen.   Follow Up Instructions: I discussed the assessment and treatment plan with the  patient. The patient was provided an opportunity to ask questions and all were answered. The patient agreed with the plan and demonstrated an understanding of the instructions.  A copy of instructions were sent to the patient via MyChart unless otherwise noted below.     The patient was advised to call back or seek an in-person evaluation if the symptoms worsen or if the condition fails to improve  as anticipated.    Georgana Curio, FNP

## 2023-10-14 ENCOUNTER — Telehealth

## 2023-10-17 ENCOUNTER — Emergency Department

## 2023-10-17 ENCOUNTER — Telehealth: Payer: Self-pay | Admitting: Family Medicine

## 2023-10-17 ENCOUNTER — Other Ambulatory Visit: Payer: Self-pay

## 2023-10-17 ENCOUNTER — Emergency Department
Admission: EM | Admit: 2023-10-17 | Discharge: 2023-10-17 | Disposition: A | Discharge status: Home / Self Care | Attending: Emergency Medicine | Admitting: Emergency Medicine

## 2023-10-17 DIAGNOSIS — N132 Hydronephrosis with renal and ureteral calculous obstruction: Secondary | ICD-10-CM | POA: Diagnosis not present

## 2023-10-17 DIAGNOSIS — A084 Viral intestinal infection, unspecified: Secondary | ICD-10-CM | POA: Diagnosis not present

## 2023-10-17 DIAGNOSIS — N200 Calculus of kidney: Secondary | ICD-10-CM

## 2023-10-17 DIAGNOSIS — R109 Unspecified abdominal pain: Secondary | ICD-10-CM

## 2023-10-17 LAB — COMPREHENSIVE METABOLIC PANEL WITH GFR
ALT: 34 U/L (ref 0–44)
AST: 24 U/L (ref 15–41)
Albumin: 4.6 g/dL (ref 3.5–5.0)
Alkaline Phosphatase: 48 U/L (ref 38–126)
Anion gap: 12 (ref 5–15)
BUN: 16 mg/dL (ref 6–20)
CO2: 24 mmol/L (ref 22–32)
Calcium: 9.3 mg/dL (ref 8.9–10.3)
Chloride: 104 mmol/L (ref 98–111)
Creatinine, Ser: 1.42 mg/dL — ABNORMAL HIGH (ref 0.61–1.24)
GFR, Estimated: >60 mL/min (ref >60)
Glucose, Bld: 101 mg/dL — ABNORMAL HIGH (ref 70–99)
Potassium: 3.7 mmol/L (ref 3.5–5.1)
Sodium: 140 mmol/L (ref 135–145)
Total Bilirubin: 1 mg/dL (ref 0.0–1.2)
Total Protein: 7.5 g/dL (ref 6.5–8.1)

## 2023-10-17 LAB — URINALYSIS, ROUTINE W REFLEX MICROSCOPIC
Bilirubin Urine: NEGATIVE
Glucose, UA: NEGATIVE mg/dL
Ketones, ur: NEGATIVE mg/dL
Leukocytes,Ua: NEGATIVE
Nitrite: NEGATIVE
Protein, ur: 100 mg/dL — AB
RBC / HPF: >50 RBC/hpf (ref 0–5)
Specific Gravity, Urine: 1.024 (ref 1.005–1.030)
pH: 5 (ref 5.0–8.0)

## 2023-10-17 LAB — CBC
HCT: 42.7 % (ref 39.0–52.0)
Hemoglobin: 14.4 g/dL (ref 13.0–17.0)
MCH: 29.3 pg (ref 26.0–34.0)
MCHC: 33.7 g/dL (ref 30.0–36.0)
MCV: 86.8 fL (ref 80.0–100.0)
Platelets: 334 10*3/uL (ref 150–400)
RBC: 4.92 MIL/uL (ref 4.22–5.81)
RDW: 12.8 % (ref 11.5–15.5)
WBC: 12.2 10*3/uL — ABNORMAL HIGH (ref 4.0–10.5)
nRBC: 0 % (ref 0.0–0.2)

## 2023-10-17 LAB — LIPASE, BLOOD: Lipase: 28 U/L (ref 11–51)

## 2023-10-17 MED ORDER — ONDANSETRON 4 MG PO TBDP: 4.0000 mg | ORAL_TABLET | Freq: Three times a day (TID) | ORAL | 0 refills | Status: DC | PRN

## 2023-10-17 MED ORDER — IOHEXOL 300 MG/ML  SOLN
100.0000 mL | Freq: Once | INTRAMUSCULAR | Status: AC | PRN
  Administered 2023-10-17: 100 mL via INTRAVENOUS

## 2023-10-17 MED ORDER — TAMSULOSIN HCL 0.4 MG PO CAPS: 0.4000 mg | ORAL_CAPSULE | Freq: Every day | ORAL | 1 refills | Status: DC

## 2023-10-17 MED ORDER — TAMSULOSIN HCL 0.4 MG PO CAPS
0.4000 mg | ORAL_CAPSULE | Freq: Once | ORAL | Status: AC
  Administered 2023-10-17: 0.4 mg via ORAL
  Filled 2023-10-17: qty 1

## 2023-10-17 MED ORDER — ONDANSETRON HCL 4 MG/2ML IJ SOLN
4.0000 mg | Freq: Once | INTRAMUSCULAR | Status: AC
  Administered 2023-10-17: 4 mg via INTRAVENOUS
  Filled 2023-10-17: qty 2

## 2023-10-17 MED ORDER — KETOROLAC TROMETHAMINE 15 MG/ML IJ SOLN
15.0000 mg | Freq: Once | INTRAMUSCULAR | Status: AC
  Administered 2023-10-17: 15 mg via INTRAVENOUS
  Filled 2023-10-17: qty 1

## 2023-10-17 MED ORDER — SODIUM CHLORIDE 0.9 % IV BOLUS
500.0000 mL | Freq: Once | INTRAVENOUS | Status: AC
  Administered 2023-10-17: 500 mL via INTRAVENOUS

## 2023-10-17 NOTE — Patient Instructions (Signed)
 Diarrhea, Adult Diarrhea is frequent loose and sometimes watery bowel movements. Diarrhea can make you feel weak and cause you to become dehydrated. Dehydration is a condition in which there is not enough water or other fluids in the body. Dehydration can make you tired and thirsty, cause you to have a dry mouth, and decrease how often you urinate. Diarrhea typically lasts 2-3 days. However, it can last longer if it is a sign of something more serious. It is important to treat your diarrhea as told by your health care provider. Follow these instructions at home: Eating and drinking     Follow these recommendations as told by your health care provider: Take an oral rehydration solution (ORS). This is an over-the-counter medicine that helps return your body to its normal balance of nutrients and water. It is found at pharmacies and retail stores. Drink enough fluid to keep your urine pale yellow. Drink fluids such as water, diluted fruit juice, and low-calorie sports drinks. You can drink milk also, if desired. Sucking on ice chips is another way to get fluids. Avoid drinking fluids that contain a lot of sugar or caffeine, such as soda, energy drinks, and regular sports drinks. Avoid alcohol. Eat bland, easy-to-digest foods in small amounts as you are able. These foods include bananas, applesauce, rice, lean meats, toast, and crackers. Avoid spicy or fatty foods.  Medicines Take over-the-counter and prescription medicines only as told by your health care provider. If you were prescribed antibiotics, take them as told by your health care provider. Do not stop using the antibiotic even if you start to feel better. General instructions  Wash your hands often using soap and water for at least 20 seconds. If soap and water are not available, use hand sanitizer. Others in the household should wash their hands as well. Hands should be washed: After using the toilet or changing a diaper. Before  preparing, cooking, or serving food. While caring for a sick person or while visiting someone in a hospital. Rest at home while you recover. Take a warm bath to relieve any burning or pain from frequent diarrhea episodes. Watch your condition for any changes. Contact a health care provider if: You have a fever. Your diarrhea gets worse. You have new symptoms. You vomit every time you eat or drink. You feel light-headed, dizzy, or have a headache. You have muscle cramps. You have signs of dehydration, such as: Dark urine, very little urine, or no urine. Cracked lips. Dry mouth. Sunken eyes. Sleepiness. Weakness. You have bloody or black stools or stools that look like tar. You have severe pain, cramping, or bloating in your abdomen. Your skin feels cold and clammy. You feel confused. Get help right away if: You have chest pain or your heart is beating very quickly. You have trouble breathing or you are breathing very quickly. You feel extremely weak or you faint. These symptoms may be an emergency. Get help right away. Call 911. Do not wait to see if the symptoms will go away. Do not drive yourself to the hospital. This information is not intended to replace advice given to you by your health care provider. Make sure you discuss any questions you have with your health care provider. Document Revised: 11/20/2021 Document Reviewed: 11/20/2021 Elsevier Patient Education  2024 [employer].

## 2023-10-17 NOTE — Progress Notes (Signed)
 Virtual Visit Consent   [name], you are scheduled for a virtual visit with a [facility] provider today. Just as with appointments in the office, your consent must be obtained to participate. Your consent will be active for this visit and any virtual visit you may have with one of our providers in the next 365 days. If you have a MyChart account, a copy of this consent can be sent to you electronically.  As this is a virtual visit, video technology does not allow for your provider to perform a traditional examination. This may limit your provider's ability to fully assess your condition. If your provider identifies any concerns that need to be evaluated in person or the need to arrange testing (such as labs, EKG, etc.), we will make arrangements to do so. Although advances in technology are sophisticated, we cannot ensure that it will always work on either your end or our end. If the connection with a video visit is poor, the visit may have to be switched to a telephone visit. With either a video or telephone visit, we are not always able to ensure that we have a secure connection.  By engaging in this virtual visit, you consent to the provision of healthcare and authorize for your insurance to be billed (if applicable) for the services provided during this visit. Depending on your insurance coverage, you may receive a charge related to this service.  I need to obtain your verbal consent now. Are you willing to proceed with your visit today? [name] has provided verbal consent on 10/17/2023 for a virtual visit (video or telephone). Albertha Huger, FNP  Date: 10/17/2023 10:26 AM   Virtual Visit via Video Note   I, Albertha Huger, connected with  Tony Lowery  ([id number], [birth year]) on 10/17/23 at 10:15 AM EDT by a video-enabled telemedicine application and verified that I am speaking with the correct person using two identifiers.  Location: Patient: Virtual Visit Location Patient:  Home Provider: Virtual Visit Location Provider: Home Office   I discussed the limitations of evaluation and management by telemedicine and the availability of in person appointments. The patient expressed understanding and agreed to proceed.    History of Present Illness: Tony Lowery is a 33 y.o. who identifies as a male who was assigned male at birth, and is being seen today for nausea and diarrhea for almost a week. Fever for 2 days. Sx started Saturday. No vomiting. Abd cramping. Sx improved yesterday then worsened today. Aaron Aas  HPI: HPI  Problems: There are no active problems to display for this patient.   Allergies:  Allergies  Allergen Reactions   Amoxicillin  Rash   Medications:  Current Outpatient Medications:    benzonatate  (TESSALON ) 100 MG capsule, Take 1 capsule (100 mg total) by mouth 3 (three) times daily as needed for cough., Disp: 30 capsule, Rfl: 0   cyclobenzaprine  (FLEXERIL ) 10 MG tablet, Take 1 tablet (10 mg total) by mouth 3 (three) times daily as needed for muscle spasms., Disp: 30 tablet, Rfl: 0   naproxen  (NAPROSYN ) 500 MG tablet, Take 1 tablet (500 mg total) by mouth 2 (two) times daily with a meal., Disp: 30 tablet, Rfl: 0   predniSONE  (DELTASONE ) 20 MG tablet, Take 2 tablets (40 mg total) by mouth daily with breakfast., Disp: 10 tablet, Rfl: 0  Observations/Objective: Patient is well-developed, well-nourished in no acute distress.  Resting comfortably  at home.  Head is normocephalic, atraumatic.  No labored breathing.  Speech is clear and coherent  with logical content.  Patient is alert and oriented at baseline.    Assessment and Plan: 1. Viral gastroenteritis (Primary)  Increase fluids, no milk or dairy, UC if sx worsen.   Follow Up Instructions: I discussed the assessment and treatment plan with the patient. The patient was provided an opportunity to ask questions and all were answered. The patient agreed with the plan and demonstrated an understanding  of the instructions.  A copy of instructions were sent to the patient via MyChart unless otherwise noted below.     The patient was advised to call back or seek an in-person evaluation if the symptoms worsen or if the condition fails to improve as anticipated.    [name], FNP

## 2023-10-17 NOTE — Discharge Instructions (Signed)
 Your evaluated in the ED for left flank pain.  Your CT abdomen pelvis revealed a 4 mm kidney stone.  An incidental finding of a liver lesion was also noticed on CT imaging.  Please follow-up with urology.  Call and schedule appointment for next week.  Call GI for further evaluation of the.  Call and schedule appointment next week.

## 2023-10-17 NOTE — ED Triage Notes (Signed)
 Pt sts that he is having abd and flank pain on the left side. Pt sts that he is not sure if he has a kidney infection or something. Pt denies any burning with urination.

## 2023-10-17 NOTE — ED Provider Notes (Signed)
 [facility] Emergency Department Provider Note     Event Date/Time   First MD Initiated Contact with Patient 10/17/23 1731     (approximate)   History   Abdominal Pain   HPI  Tony Lowery is a 33 y.o. male with no significant past medical history presents to the ED for evaluation of a intermittent left lower back pain and left flank pain x 3 days. Associated symptoms include nausea and subjective fever at home.  Patient reports history of kidney stone and this presentation is not similar.  He denies urinary symptoms, hematuria. Patient reports diarrhea and generalized abdominal pain onset x 6 days ago that has now resolved.     Physical Exam   Triage Vital Signs: ED Triage Vitals  Encounter Vitals Group     BP 10/17/23 1415 (!) 133/94     Systolic BP Percentile --      Diastolic BP Percentile --      Pulse Rate 10/17/23 1415 (!) 118     Resp 10/17/23 1415 (!) 21     Temp 10/17/23 1415 98.2 F (36.8 C)     Temp Source 10/17/23 1415 Oral     SpO2 10/17/23 1415 100 %     Weight 10/17/23 1420 270 lb (122.5 kg)     Height 10/17/23 1420 5\' 8"  (1.727 m)     Head Circumference --      Peak Flow --      Pain Score 10/17/23 1420 10     Pain Loc --      Pain Education --      Exclude from Growth Chart --     Most recent vital signs: Vitals:   10/17/23 1415 10/17/23 1750  BP: (!) 133/94 (!) 138/95  Pulse: (!) 118 96  Resp: (!) 21 19  Temp: 98.2 F (36.8 C) 98.6 F (37 C)  SpO2: 100% 98%    General: Alert and oriented. INAD.     Head:  NCAT.  CV:  Good peripheral perfusion.  RESP:  Normal effort. ABD:  No distention. Soft, Non tender. No masses or organomegaly. (+) left CVA tenderness.   ED Results / Procedures / Treatments   Labs (all labs ordered are listed, but only abnormal results are displayed) Labs Reviewed  COMPREHENSIVE METABOLIC PANEL WITH GFR - Abnormal; Notable for the following components:      Result Value   Glucose,  Bld 101 (*)    Creatinine, Ser 1.42 (*)    All other components within normal limits  CBC - Abnormal; Notable for the following components:   WBC 12.2 (*)    All other components within normal limits  URINALYSIS, ROUTINE W REFLEX MICROSCOPIC - Abnormal; Notable for the following components:   Color, Urine YELLOW (*)    APPearance CLOUDY (*)    Hgb urine dipstick LARGE (*)    Protein, ur 100 (*)    Bacteria, UA RARE (*)    All other components within normal limits  LIPASE, BLOOD   RADIOLOGY  I personally viewed and evaluated these images as part of my medical decision making, as well as reviewing the written report by the radiologist.  ED Provider Interpretation: Stone in the proximal left ureter with moderate proximal obstruction.  CT ABDOMEN PELVIS W CONTRAST Result Date: 10/17/2023 CLINICAL DATA:  Abdominal and flank pain with stone suspected. EXAM: CT ABDOMEN AND PELVIS WITH CONTRAST TECHNIQUE: Multidetector CT imaging of the abdomen and pelvis was performed using the  standard protocol following bolus administration of intravenous contrast. RADIATION DOSE REDUCTION: This exam was performed according to the departmental dose-optimization program which includes automated exposure control, adjustment of the mA and/or kV according to patient size and/or use of iterative reconstruction technique. CONTRAST:  100mL OMNIPAQUE IOHEXOL 300 MG/ML  SOLN COMPARISON:  06/16/2020 FINDINGS: Lower chest: Lung bases are clear. Hepatobiliary: Mild diffuse fatty infiltration of the liver. Focal low-attenuation lesion in segment 6 measuring 3.2 cm diameter. This was not demonstrated in the prior study although prior study was obtained without IV contrast material. Lesion is indeterminate and could represent a solid mass or hemangioma. Consider ultrasound or MRI for further characterization. Gallbladder and bile ducts are normal. Pancreas: Unremarkable. No pancreatic ductal dilatation or surrounding inflammatory  changes. Spleen: Normal in size without focal abnormality. Adrenals/Urinary Tract: No adrenal gland nodules. Delayed nephrogram on the left. Left hydronephrosis with a 4 mm stone in the proximal left ureter at the ureteropelvic junction. Punctate calcification in the lower pole left kidney. Bladder is normal. Stomach/Bowel: Stomach, small bowel, and colon are not abnormally distended. No wall thickening or inflammatory changes. The appendix is morphologically normal but contains appendicoliths. No inflammatory changes or dilatation. Vascular/Lymphatic: No significant vascular findings are present. No enlarged abdominal or pelvic lymph nodes. Reproductive: Prostate is unremarkable. Other: No abdominal wall hernia or abnormality. No abdominopelvic ascites. Musculoskeletal: No acute or significant osseous findings. IMPRESSION: 1. 4 mm stone in the proximal left ureter with moderate proximal obstruction. Additional nonobstructing stone in the left lower pole. 2. Fatty infiltration of the liver. 3. Appendicoliths in the appendix but no evidence of acute appendicitis. 4. 3.2 cm indeterminate low-attenuation lesion in the liver. Consider further evaluation with ultrasound or MRI. Electronically Signed   By: Boyce Byes M.D.   On: 10/17/2023 19:07    PROCEDURES:  Critical Care performed: No  Procedures   MEDICATIONS ORDERED IN ED: Medications  ketorolac  (TORADOL ) 15 MG/ML injection 15 mg (15 mg Intravenous Given 10/17/23 1827)  ondansetron  (ZOFRAN ) injection 4 mg (4 mg Intravenous Given 10/17/23 1828)  iohexol (OMNIPAQUE) 300 MG/ML solution 100 mL (100 mLs Intravenous Contrast Given 10/17/23 1846)  sodium chloride 0.9 % bolus 500 mL (500 mLs Intravenous New Bag/Given 10/17/23 1931)  tamsulosin  (FLOMAX ) capsule 0.4 mg (0.4 mg Oral Given 10/17/23 1938)     IMPRESSION / MDM / ASSESSMENT AND PLAN / ED COURSE  I reviewed the triage vital signs and the nursing notes.                              Clinical  Course as of 10/17/23 1941  Fri Oct 17, 2023  1807 Creatinine(!): 1.42 [MH]  1807 Hgb urine dipstick(!): LARGE [MH]  1807 WBC(!): 12.2 Mild leukocytosis [MH]  1807 Ca Oxalate Crys, UA: PRESENT [MH]  1930 Consultation with urology - Dr. Inga Manges recommended close follow-up in [place] office.  Staff message sent to admin to schedule this appointment. [MH]  1934 Consult with Dr Corky Diener GI of incidental finding of lesion in the liver - recommendation follow up in the office next week [MH]    Clinical Course User Index [MH] Alvaro Augusta A, PA-C    33 y.o. male presents to the emergency department for evaluation and treatment of left flank pain. See HPI for further details.   Differential diagnosis includes, but is not limited to nephrolithiasis, pyelonephritis, viral gastroenteritis  Patient's presentation is most consistent with acute complicated illness / injury  requiring diagnostic workup.  Patient is alert and oriented.  He is mildly tachycardic at 118 but this has since resolved.  Physical exam findings are stated above.  CT abdomen pelvis revealed 4 mm left kidney stone.  Patient will follow-up in the office next week.  Incidental finding of liver lesion on imaging.  Patient advised to follow-up with GI.  IV fluids administered along with tamsulosin  in the ED.  Patient is in stable condition for discharge home and outpatient management.  ED return precautions discussed   FINAL CLINICAL IMPRESSION(S) / ED DIAGNOSES   Final diagnoses:  Nephrolithiasis   Rx / DC Orders   ED Discharge Orders          Ordered    tamsulosin  (FLOMAX ) 0.4 MG CAPS capsule  Daily        10/17/23 1933    ondansetron  (ZOFRAN -ODT) 4 MG disintegrating tablet  Every 8 hours PRN        10/17/23 1933            Note:  This document was prepared using Dragon voice recognition software and may include unintentional dictation errors.    Phyllis Breeze, Kennie Snedden A, PA-C 10/17/23 1942    Jacquie Maudlin,  MD 10/18/23 2005

## 2023-11-11 ENCOUNTER — Ambulatory Visit (INDEPENDENT_AMBULATORY_CARE_PROVIDER_SITE_OTHER): Admitting: Urology

## 2023-11-11 VITALS — BP 130/88 | HR 128 | Ht 69.0 in | Wt 270.0 lb

## 2023-11-11 DIAGNOSIS — K769 Liver disease, unspecified: Secondary | ICD-10-CM

## 2023-11-11 DIAGNOSIS — N200 Calculus of kidney: Secondary | ICD-10-CM

## 2023-11-11 DIAGNOSIS — Z758 Other problems related to medical facilities and other health care: Secondary | ICD-10-CM | POA: Diagnosis not present

## 2023-11-11 LAB — URINALYSIS, COMPLETE
Bilirubin, UA: NEGATIVE
Glucose, UA: NEGATIVE
Ketones, UA: NEGATIVE
Leukocytes,UA: NEGATIVE
Nitrite, UA: NEGATIVE
Protein,UA: NEGATIVE
RBC, UA: NEGATIVE
Specific Gravity, UA: 1.025 (ref 1.005–1.030)
Urobilinogen, Ur: 1 mg/dL (ref 0.2–1.0)
pH, UA: 6.5 (ref 5.0–7.5)

## 2023-11-11 LAB — MICROSCOPIC EXAMINATION

## 2023-11-11 NOTE — Progress Notes (Signed)
 I,Amy L Pierron,acting as a scribe for Dustin Gimenez, MD.,have documented all relevant documentation on the behalf of Dustin Gimenez, MD,as directed by  Dustin Gimenez, MD while in the presence of Dustin Gimenez, MD.  11/11/2023 3:13 PM   Tony Lowery 1990/11/14 [id number]   Chief Complaint  Patient presents with   Nephrolithiasis    HPI: 33 year-old male with a personal history of kidney stones presents today for further evaluation.   He was seen in the emergency room on 10/17/2023 for intermittent left lower back pain lasting three days, accompanied by nausea and subjective fevers, which have since resolved. He denied any urinary symptoms but reported diarrhea. Urinalysis at that time showed greater than fifty RBCs and calcium oxalate crystals. His creatinine was elevated at 1.42, and he had mild leukocytosis at 12.2.   A CT abdomen pelvis with contrast showed moderate left-sided hydronephrosis with a 4-millimeter left proximal ureteral stone. No other kidney stones appreciable.  He reports no current pain or urinary issues and has been taking Flomax . He has a history of a 2-millimeter kidney stone a couple of years ago, which he does not recall passing.   He drinks a lot of tea.   He has not seen a primary care physician since age 70.  PMH: Past Medical History:  Diagnosis Date   Irregular heartbeat     Home Medications:  Allergies as of 11/11/2023       Reactions   Amoxicillin  Rash        Medication List        Accurate as of Nov 11, 2023  3:13 PM. If you have any questions, ask your nurse or doctor.          STOP taking these medications    naproxen  500 MG tablet Commonly known as: Naprosyn    predniSONE  20 MG tablet Commonly known as: DELTASONE        TAKE these medications    benzonatate  100 MG capsule Commonly known as: TESSALON  Take 1 capsule (100 mg total) by mouth 3 (three) times daily as needed for cough.   cyclobenzaprine  10 MG  tablet Commonly known as: FLEXERIL  Take 1 tablet (10 mg total) by mouth 3 (three) times daily as needed for muscle spasms.   ondansetron  4 MG disintegrating tablet Commonly known as: ZOFRAN -ODT Take 1 tablet (4 mg total) by mouth every 8 (eight) hours as needed.   tamsulosin  0.4 MG Caps capsule Commonly known as: FLOMAX  Take 1 capsule (0.4 mg total) by mouth daily.        Allergies:  Allergies  Allergen Reactions   Amoxicillin  Rash    Social History:  reports that he has been smoking cigarettes. He has never used smokeless tobacco. He reports that he does not drink alcohol. No history on file for drug use.   Physical Exam: BP 130/88   Pulse (!) 128   Ht 5\' 9"  (1.753 m)   Wt 270 lb (122.5 kg)   BMI 39.87 kg/m   Constitutional:  Alert and oriented, No acute distress. HEENT: [place] AT, moist mucus membranes.  Trachea midline, no masses. Neurologic: Grossly intact, no focal deficits, moving all 4 extremities. Psychiatric: Normal mood and affect.   Pertinent Imaging: Narrative & Impression  CLINICAL DATA:  Abdominal and flank pain with stone suspected.   EXAM: CT ABDOMEN AND PELVIS WITH CONTRAST   TECHNIQUE: Multidetector CT imaging of the abdomen and pelvis was performed using the standard protocol following bolus administration of intravenous contrast.  RADIATION DOSE REDUCTION: This exam was performed according to the departmental dose-optimization program which includes automated exposure control, adjustment of the mA and/or kV according to patient size and/or use of iterative reconstruction technique.   CONTRAST:  OMNIPAQUE  IOHEXOL  300 MG/ML  SOLN   COMPARISON:  06/16/2020   FINDINGS: Lower chest: Lung bases are clear.   Hepatobiliary: Mild diffuse fatty infiltration of the liver. Focal low-attenuation lesion in segment 6 measuring 3.2 cm diameter. This was not demonstrated in the prior study although prior study was obtained without IV contrast  material. Lesion is indeterminate and could represent a solid mass or hemangioma. Consider ultrasound or MRI for further characterization. Gallbladder and bile ducts are normal.   Pancreas: Unremarkable. No pancreatic ductal dilatation or surrounding inflammatory changes.   Spleen: Normal in size without focal abnormality.   Adrenals/Urinary Tract: No adrenal gland nodules. Delayed nephrogram on the left. Left hydronephrosis with a 4 mm stone in the proximal left ureter at the ureteropelvic junction. Punctate calcification in the lower pole left kidney. Bladder is normal.   Stomach/Bowel: Stomach, small bowel, and colon are not abnormally distended. No wall thickening or inflammatory changes. The appendix is morphologically normal but contains appendicoliths. No inflammatory changes or dilatation.   Vascular/Lymphatic: No significant vascular findings are present. No enlarged abdominal or pelvic lymph nodes.   Reproductive: Prostate is unremarkable.   Other: No abdominal wall hernia or abnormality. No abdominopelvic ascites.   Musculoskeletal: No acute or significant osseous findings.   IMPRESSION: 1. 4 mm stone in the proximal left ureter with moderate proximal obstruction. Additional nonobstructing stone in the left lower pole. 2. Fatty infiltration of the liver. 3. Appendicoliths in the appendix but no evidence of acute appendicitis. 4. 3.2 cm indeterminate low-attenuation lesion in the liver. Consider further evaluation with ultrasound or MRI.   Electronically Signed   By: Boyce Byes M.D.   On: 10/17/2023 19:07  Personally reviewed the above scans and agree with radiologic interpretation.   Assessment & Plan:    1. Left proximal ureteral stone with hydronephrosis - Likely passed the stone, as symptoms have resolved and there is no longer hematuria. A kidney function test will be ordered to confirm normalization of renal function. If renal function is normal,  no further imaging is necessary.   - Advised to maintain adequate hydration and dietary modifications to prevent recurrence, including reducing oxalate intake from tea and ensuring urine is clear. We discussed general stone prevention techniques including drinking plenty water with goal of producing 2.5 L urine daily, increased citric acid intake, avoidance of high oxalate containing foods, and decreased salt intake.  Information about dietary recommendations given today.   2. Elevated creatinine/ AKI  - Likely secondary to dehydration and obstruction from the kidney stone. Follow-up kidney function test will assess for resolution.  3. Indeterminate Liver lesion - An ultrasound of the liver will be ordered to further evaluate the incidental finding of a liver lesion. Referral to an appropriate specialist will be made based on ultrasound results. Also encouraged making an appointment for routine care with a primary care physician.   4. Does not have primary care provider Extensive counseling today about the importance of establishing with primary care.  Suspect underlying health conditions that are unmanaged at this time including HTN, fatty liver, possible DM (elevated blood sugars), obesity, etc.  He was provided with resources.   Return in about 2 weeks (around 11/25/2023) for kidney function test results.  I have reviewed the  above documentation for accuracy and completeness, and I agree with the above.   Dustin Gimenez, MD   [facility] [address] [place], [place] [address] [phone]

## 2023-11-11 NOTE — Patient Instructions (Addendum)
 [facility] Phone: [phone]   Call [phone] to schedule

## 2023-11-12 ENCOUNTER — Ambulatory Visit: Payer: Self-pay | Admitting: Urology

## 2023-11-12 LAB — BASIC METABOLIC PANEL WITH GFR
BUN/Creatinine Ratio: 10 (ref 9–20)
BUN: 9 mg/dL (ref 6–20)
CO2: 21 mmol/L (ref 20–29)
Calcium: 9.1 mg/dL (ref 8.7–10.2)
Chloride: 104 mmol/L (ref 96–106)
Creatinine, Ser: 0.9 mg/dL (ref 0.76–1.27)
Glucose: 125 mg/dL — ABNORMAL HIGH (ref 70–99)
Potassium: 4.7 mmol/L (ref 3.5–5.2)
Sodium: 140 mmol/L (ref 134–144)
eGFR: 117 mL/min/{1.73_m2} (ref >59)

## 2023-11-18 ENCOUNTER — Ambulatory Visit: Admitting: Urology

## 2024-03-18 ENCOUNTER — Emergency Department
Admission: EM | Admit: 2024-03-18 | Discharge: 2024-03-18 | Disposition: A | Discharge status: Home / Self Care | Attending: Emergency Medicine | Admitting: Emergency Medicine

## 2024-03-18 ENCOUNTER — Encounter: Payer: Self-pay | Admitting: Emergency Medicine

## 2024-03-18 ENCOUNTER — Emergency Department

## 2024-03-18 ENCOUNTER — Other Ambulatory Visit: Payer: Self-pay

## 2024-03-18 DIAGNOSIS — N132 Hydronephrosis with renal and ureteral calculous obstruction: Secondary | ICD-10-CM | POA: Diagnosis not present

## 2024-03-18 DIAGNOSIS — R1030 Lower abdominal pain, unspecified: Secondary | ICD-10-CM | POA: Diagnosis present

## 2024-03-18 DIAGNOSIS — N200 Calculus of kidney: Secondary | ICD-10-CM

## 2024-03-18 HISTORY — DX: Calculus of kidney: N20.0

## 2024-03-18 LAB — URINALYSIS, ROUTINE W REFLEX MICROSCOPIC
Bacteria, UA: NONE SEEN
Bilirubin Urine: NEGATIVE
Glucose, UA: NEGATIVE mg/dL
Ketones, ur: NEGATIVE mg/dL
Leukocytes,Ua: NEGATIVE
Nitrite: NEGATIVE
Protein, ur: 30 mg/dL — AB
Specific Gravity, Urine: 1.03 (ref 1.005–1.030)
pH: 5 (ref 5.0–8.0)

## 2024-03-18 LAB — CBC WITH DIFFERENTIAL/PLATELET
Abs Immature Granulocytes: 0.03 K/uL (ref 0.00–0.07)
Basophils Absolute: 0.1 K/uL (ref 0.0–0.1)
Basophils Relative: 1 %
Eosinophils Absolute: 0.7 K/uL — ABNORMAL HIGH (ref 0.0–0.5)
Eosinophils Relative: 7 %
HCT: 44.8 % (ref 39.0–52.0)
Hemoglobin: 15.3 g/dL (ref 13.0–17.0)
Immature Granulocytes: 0 %
Lymphocytes Relative: 27 %
Lymphs Abs: 2.6 K/uL (ref 0.7–4.0)
MCH: 29.7 pg (ref 26.0–34.0)
MCHC: 34.2 g/dL (ref 30.0–36.0)
MCV: 87 fL (ref 80.0–100.0)
Monocytes Absolute: 0.8 K/uL (ref 0.1–1.0)
Monocytes Relative: 8 %
Neutro Abs: 5.7 K/uL (ref 1.7–7.7)
Neutrophils Relative %: 57 %
Platelets: 310 K/uL (ref 150–400)
RBC: 5.15 MIL/uL (ref 4.22–5.81)
RDW: 12.7 % (ref 11.5–15.5)
WBC: 9.9 K/uL (ref 4.0–10.5)
nRBC: 0 % (ref 0.0–0.2)

## 2024-03-18 LAB — COMPREHENSIVE METABOLIC PANEL WITH GFR
ALT: 38 U/L (ref 0–44)
AST: 24 U/L (ref 15–41)
Albumin: 4.3 g/dL (ref 3.5–5.0)
Alkaline Phosphatase: 49 U/L (ref 38–126)
Anion gap: 8 (ref 5–15)
BUN: 18 mg/dL (ref 6–20)
CO2: 24 mmol/L (ref 22–32)
Calcium: 9 mg/dL (ref 8.9–10.3)
Chloride: 108 mmol/L (ref 98–111)
Creatinine, Ser: 0.98 mg/dL (ref 0.61–1.24)
GFR, Estimated: >60 mL/min (ref >60)
Glucose, Bld: 122 mg/dL — ABNORMAL HIGH (ref 70–99)
Potassium: 4.3 mmol/L (ref 3.5–5.1)
Sodium: 140 mmol/L (ref 135–145)
Total Bilirubin: 1 mg/dL (ref 0.0–1.2)
Total Protein: 7.5 g/dL (ref 6.5–8.1)

## 2024-03-18 LAB — CHLAMYDIA/NGC RT PCR (ARMC ONLY)
Chlamydia Tr: NOT DETECTED
N gonorrhoeae: NOT DETECTED

## 2024-03-18 MED ORDER — OXYCODONE HCL 5 MG PO TABS: 10.0000 mg | ORAL_TABLET | Freq: Three times a day (TID) | ORAL | 0 refills | Status: AC | PRN

## 2024-03-18 MED ORDER — SODIUM CHLORIDE 0.9 % IV BOLUS (SEPSIS)
1000.0000 mL | Freq: Once | INTRAVENOUS | Status: AC
  Administered 2024-03-18: 1000 mL via INTRAVENOUS

## 2024-03-18 MED ORDER — TAMSULOSIN HCL 0.4 MG PO CAPS: 0.4000 mg | ORAL_CAPSULE | Freq: Every day | ORAL | 0 refills | Status: AC

## 2024-03-18 MED ORDER — ONDANSETRON 4 MG PO TBDP: 4.0000 mg | ORAL_TABLET | Freq: Four times a day (QID) | ORAL | 0 refills | Status: AC | PRN

## 2024-03-18 MED ORDER — IBUPROFEN 800 MG PO TABS: 800.0000 mg | ORAL_TABLET | Freq: Three times a day (TID) | ORAL | 0 refills | Status: AC | PRN

## 2024-03-18 MED ORDER — ONDANSETRON HCL 4 MG/2ML IJ SOLN
4.0000 mg | Freq: Once | INTRAMUSCULAR | Status: AC
  Administered 2024-03-18: 4 mg via INTRAVENOUS
  Filled 2024-03-18: qty 2

## 2024-03-18 MED ORDER — KETOROLAC TROMETHAMINE 30 MG/ML IJ SOLN
30.0000 mg | Freq: Once | INTRAMUSCULAR | Status: AC
  Administered 2024-03-18: 30 mg via INTRAVENOUS
  Filled 2024-03-18: qty 1

## 2024-03-18 MED ORDER — HYDROMORPHONE HCL 1 MG/ML IJ SOLN
1.0000 mg | Freq: Once | INTRAMUSCULAR | Status: AC
  Administered 2024-03-18: 1 mg via INTRAVENOUS
  Filled 2024-03-18: qty 1

## 2024-03-18 NOTE — ED Triage Notes (Signed)
 Patient ambulatory to triage with steady gait, appears uncomfortable, grimacing; Pt reports mid lower back pain radiating around into mid lower abd and urge to void

## 2024-03-18 NOTE — Discharge Instructions (Addendum)

## 2024-03-18 NOTE — ED Provider Notes (Signed)
 [facility] Provider Note    Event Date/Time   First MD Initiated Contact with Patient 03/18/24 [phone]     (approximate)   History   Flank Pain   HPI  Tony Lowery is a 33 y.o. male with history of kidney stones, obesity who presents to the emergency department with sudden onset mid low back pain that radiates into the lower abdomen, suprapubic pressure, discomfort with urination.  States he feels like he needs to have a bowel movement but had a normal bowel movement at home without any relief.  Has had nausea without vomiting.  States over the past 1 to 2 months he has had symptoms where he will have intermittent dysuria, difficulty voiding.  He was treated for a UTI mid August after a telemetry visit and states symptoms resolved but then have recently returned.  Went to bed however feeling in his normal state of health.  Woke up suddenly this morning with severe pain.  No discharge, testicular pain or swelling.  States that this feels different than his previous kidney stones.  No prior abdominal surgeries.  No fevers, diarrhea.  Denies injury to the back.   History provided by patient.    Past Medical History:  Diagnosis Date   Irregular heartbeat    Kidney stone     History reviewed. No pertinent surgical history.  MEDICATIONS:  Prior to Admission medications   Medication Sig Start Date End Date Taking? Authorizing Provider  benzonatate  (TESSALON ) 100 MG capsule Take 1 capsule (100 mg total) by mouth 3 (three) times daily as needed for cough. 12/02/22   Gladis Elsie BROCKS, PA-C  cyclobenzaprine  (FLEXERIL ) 10 MG tablet Take 1 tablet (10 mg total) by mouth 3 (three) times daily as needed for muscle spasms. 03/28/23   Blair, Diane W, FNP  ondansetron  (ZOFRAN -ODT) 4 MG disintegrating tablet Take 1 tablet (4 mg total) by mouth every 8 (eight) hours as needed. 10/17/23   Margrette, Myah A, PA-C  tamsulosin  (FLOMAX ) 0.4 MG CAPS capsule Take 1 capsule (0.4 mg  total) by mouth daily. 10/17/23   Margrette Rebbeca LABOR, PA-C    Physical Exam   Triage Vital Signs: ED Triage Vitals  Encounter Vitals Group     BP 03/18/24 0554 (!) 156/105     Girls Systolic BP Percentile --      Girls Diastolic BP Percentile --      Boys Systolic BP Percentile --      Boys Diastolic BP Percentile --      Pulse Rate 03/18/24 0554 (!) 104     Resp 03/18/24 0554 (!) 24     Temp --      Temp src --      SpO2 03/18/24 0554 99 %     Weight 03/18/24 0550 290 lb (131.5 kg)     Height 03/18/24 0550 5' 8 (1.727 m)     Head Circumference --      Peak Flow --      Pain Score 03/18/24 0550 10     Pain Loc --      Pain Education --      Exclude from Growth Chart --      Most recent vital signs: Vitals:   03/18/24 0554 03/18/24 0631  BP: (!) 156/105   Pulse: (!) 104   Resp: (!) 24   Temp:  99 F (37.2 C)  SpO2: 99%     CONSTITUTIONAL: Alert, responds appropriately to questions.  Appears uncomfortable, standing  up leaning over the bed, tearful HEAD: Normocephalic, atraumatic EYES: Conjunctivae clear, pupils appear equal, sclera nonicteric ENT: normal nose; moist mucous membranes NECK: Supple, normal ROM CARD: RRR; S1 and S2 appreciated RESP: Normal chest excursion without splinting or tachypnea; breath sounds clear and equal bilaterally; no wheezes, no rhonchi, no rales, no hypoxia or respiratory distress, speaking full sentences ABD/GI: Non-distended; soft, non-tender, no rebound, no guarding, no peritoneal signs BACK: The back appears normal, no midline spinal tenderness or step-off or deformity EXT: Normal ROM in all joints; no deformity noted, no edema SKIN: Normal color for age and race; warm; no rash on exposed skin NEURO: Moves all extremities equally, normal speech, ambulates with normal gait PSYCH: The patient's mood and manner are appropriate.   ED Results / Procedures / Treatments   LABS: (all labs ordered are listed, but only abnormal results are  displayed) Labs Reviewed  CBC WITH DIFFERENTIAL/PLATELET - Abnormal; Notable for the following components:      Result Value   Eosinophils Absolute 0.7 (*)    All other components within normal limits  COMPREHENSIVE METABOLIC PANEL WITH GFR - Abnormal; Notable for the following components:   Glucose, Bld 122 (*)    All other components within normal limits  URINE CULTURE  CHLAMYDIA/NGC RT PCR ([facility] ONLY)            URINALYSIS, ROUTINE W REFLEX MICROSCOPIC     EKG:   RADIOLOGY: My personal review and interpretation of imaging: CT scan shows left UVJ stone.  I have personally reviewed all radiology reports.   CT Renal Stone Study Result Date: 03/18/2024 EXAM: CT UROGRAM 03/18/2024 06:23:40 AM TECHNIQUE: CT of the abdomen and pelvis was performed without the administration of intravenous contrast as per CT urogram protocol. Multiplanar reformatted images as well as MIP urogram images are provided for review. Automated exposure control, iterative reconstruction, and/or weight based adjustment of the mA/kV was utilized to reduce the radiation dose to as low as reasonably achievable. COMPARISON: None available. CLINICAL HISTORY: Abdominal/flank pain, stone suspected. Patient ambulatory to triage with steady gait, appears uncomfortable, grimacing. Pt reports mid lower back pain radiating around into mid lower abd and urge to void. FINDINGS: LOWER CHEST: No acute abnormality. LIVER: The liver is unremarkable. GALLBLADDER AND BILE DUCTS: Gallbladder is unremarkable. No biliary ductal dilatation. SPLEEN: No acute abnormality. PANCREAS: No acute abnormality. ADRENAL GLANDS: No acute abnormality. KIDNEYS, URETERS AND BLADDER: There is a 5 x 4 mm calculus present at the left ureterovesical junction causing mild left hydroureteronephrosis. No perinephric or periureteral stranding. Urinary bladder is unremarkable. GI AND BOWEL: Stomach demonstrates no acute abnormality. There is no bowel obstruction.  PERITONEUM AND RETROPERITONEUM: No ascites. No free air. VASCULATURE: Aorta is normal in caliber. LYMPH NODES: No lymphadenopathy. REPRODUCTIVE ORGANS: No acute abnormality. BONES AND SOFT TISSUES: No acute osseous abnormality. No focal soft tissue abnormality. IMPRESSION: 1. Left ureterovesical junction calculus (5 x 4 mm) causing mild left hydroureteronephrosis. Electronically signed by: Evalene Coho MD 03/18/2024 06:37 AM EDT RP Workstation: [id number]     PROCEDURES:  Critical Care performed: No      Procedures    IMPRESSION / MDM / ASSESSMENT AND PLAN / ED COURSE  I reviewed the triage vital signs and the nursing notes.    Patient here with sudden onset low back pain that radiates into the lower abdomen.  Also reports 2 months of intermittent discomfort with urination that did improve briefly with antibiotics for UTI.  The patient is on the  cardiac monitor to evaluate for evidence of arrhythmia and/or significant heart rate changes.   DIFFERENTIAL DIAGNOSIS (includes but not limited to):   Kidney stone, UTI, pyelonephritis, urethritis, STI, musculoskeletal back pain, muscle strain, muscle spasm, doubt appendicitis, doubt cauda equina or epidural abscess or hematoma, discitis or osteomyelitis, transverse myelitis, fracture   Patient's presentation is most consistent with acute presentation with potential threat to life or bodily function.   PLAN: Will obtain labs, urine, CT of the abdomen pelvis.  Will give IV fluids, pain and nausea medicine.  Patient states that his girlfriend drove him to the ED.   MEDICATIONS GIVEN IN ED: Medications  sodium chloride  0.9 % bolus 1,000 mL (has no administration in time range)  ketorolac  (TORADOL ) 30 MG/ML injection 30 mg (30 mg Intravenous Given 03/18/24 0611)  ondansetron  (ZOFRAN ) injection 4 mg (4 mg Intravenous Given 03/18/24 0611)  HYDROmorphone (DILAUDID) injection 1 mg (1 mg Intravenous Given 03/18/24 9388)     ED COURSE: CT  scan reviewed and interpreted by myself and the radiologist and shows a 5 x 4 mm UVJ stone with mild hydronephrosis.  No leukocytosis.  Normal creatinine, LFTs.  Urine pending.  Patient states he is feeling much better and pain and nausea have resolved.  Anticipate discharge with outpatient follow-up if pain continues to be well-controlled.  Will discharge with pain and nausea medicine, Flomax  and urology follow-up.  Signed out to oncoming ED provider at 7 AM.   CONSULTS:  none   OUTSIDE RECORDS REVIEWED: Reviewed urology notes in May 2025 and PCP note for UTI on 01/25/2024.  Patient was treated with Macrobid twice daily x 5 days.       FINAL CLINICAL IMPRESSION(S) / ED DIAGNOSES   Final diagnoses:  Kidney stone     Rx / DC Orders   ED Discharge Orders          Ordered    ibuprofen  (ADVIL ) 800 MG tablet  Every 8 hours PRN        03/18/24 0645    oxyCODONE  (ROXICODONE ) 5 MG immediate release tablet  Every 8 hours PRN        03/18/24 0645    ondansetron  (ZOFRAN -ODT) 4 MG disintegrating tablet  Every 6 hours PRN        03/18/24 0645    tamsulosin  (FLOMAX ) 0.4 MG CAPS capsule  Daily        03/18/24 0645             Note:  This document was prepared using Dragon voice recognition software and may include unintentional dictation errors.   Hartlee Amedee, Josette SAILOR, DO 03/18/24 [phone]

## 2024-03-18 NOTE — ED Provider Notes (Signed)
 Remains asymptomatic, urine without infectious features, suitable for outpatient management.   Claudene Rover, MD 03/18/24 [phone]

## 2024-03-19 LAB — URINE CULTURE: Culture: NO GROWTH

## 2024-03-26 DIAGNOSIS — N200 Calculus of kidney: Secondary | ICD-10-CM | POA: Insufficient documentation

## 2024-03-26 NOTE — Progress Notes (Deleted)
   03/31/2024 8:07 AM   Tony Lowery 02/01/1991 [id number]  Reason for visit: Follow up nephrolithiasis   HPI: 33 y.o. male, initial follow up with me today, previously seen by Dr. Penne in May 2025 ED visit (03/18/2024)-acute mid low back pain CT stone (03/18/2024)--5 mm left UVJ stone, mild hydroureteronephrosis UA, urine culture -negative Treated with MET-Flomax , Advil  and oxycodone     Prior HPI: May 2025-4 mm left proximal ureteral stone   - Likely spontaneous passage, nonvisualized    Physical Exam: There were no vitals taken for this visit.   Constitutional:  Alert and oriented, No acute distress.  Laboratory Data:  Latest Reference Range & Units 03/18/24 06:09  Creatinine 0.61 - 1.24 mg/dL 9.01    Pertinent Imaging: I have personally viewed and interpreted the CT stone (03/18/2024)-obstructive left 5 mm UVJ stone with proximal hydroureteronephrosis.  Otherwise normal bilateral kidneys, no renal nephrolithiasis.     Assessment & Plan:    Nephrolithiasis Assessment & Plan: 5mm Left obstructive UVJ stone (dx 03/18/24)   We reviewed recent clinical history, lab data and pertinent imaging today. Based on the size and location of their stone, I would recommend medical expulsive therapy and a trial of passage, at least initially. Provided adequate symptomatic control and an absence of infectious signs, I typically allow 3-4 weeks for spontaneous passage. Return precautions were verbalized, including new fevers/chills, intractable N/V, inability to tolerate PO liquids, or severe refractory pain.   - MET x 3-4 weeks - Flomax  0.4mg  prescribed (#30 tabs) - r/b/SEs reviewed - Urine strainer provided   - If stone visualized, please collect and bring to clinic for analysis  - Clinic / on-call info provided - RTC in 3-4 weeks for reevaluation- if symptomatic will consider definitive treatment. If asymptomatic or unclear stone passage, will consider interval imaging          Penne JONELLE Skye, MD  [facility] [address], [address] [place], [place] [address] [phone]

## 2024-03-26 NOTE — Assessment & Plan Note (Deleted)
 5mm Left obstructive UVJ stone (dx 03/18/24)   We reviewed recent clinical history, lab data and pertinent imaging today. Based on the size and location of their stone, I would recommend medical expulsive therapy and a trial of passage, at least initially. Provided adequate symptomatic control and an absence of infectious signs, I typically allow 3-4 weeks for spontaneous passage. Return precautions were verbalized, including new fevers/chills, intractable N/V, inability to tolerate PO liquids, or severe refractory pain.   - MET x 3-4 weeks - Flomax  0.4mg  prescribed (#30 tabs) - r/b/SEs reviewed - Urine strainer provided   - If stone visualized, please collect and bring to clinic for analysis  - Clinic / on-call info provided - RTC in 3-4 weeks for reevaluation- if symptomatic will consider definitive treatment. If asymptomatic or unclear stone passage, will consider interval imaging

## 2024-03-31 ENCOUNTER — Ambulatory Visit: Admitting: Urology

## 2024-03-31 DIAGNOSIS — N200 Calculus of kidney: Secondary | ICD-10-CM
